# Patient Record
Sex: Female | Born: 1990 | Race: Black or African American | Hispanic: No | Marital: Single | Smoking: Never smoker
Health system: Southern US, Community
[De-identification: ages and names within clinical notes are randomized; demographics above are authoritative.]

## PROBLEM LIST (undated history)

## (undated) ENCOUNTER — Inpatient Hospital Stay (HOSPITAL_COMMUNITY): Payer: Self-pay

## (undated) DIAGNOSIS — N730 Acute parametritis and pelvic cellulitis: Secondary | ICD-10-CM

## (undated) DIAGNOSIS — A549 Gonococcal infection, unspecified: Secondary | ICD-10-CM

## (undated) HISTORY — DX: Acute parametritis and pelvic cellulitis: N73.0

## (undated) HISTORY — DX: Gonococcal infection, unspecified: A54.9

---

## 2012-02-20 DIAGNOSIS — A549 Gonococcal infection, unspecified: Secondary | ICD-10-CM

## 2012-02-20 HISTORY — DX: Gonococcal infection, unspecified: A54.9

## 2012-03-04 ENCOUNTER — Encounter (HOSPITAL_COMMUNITY): Payer: Self-pay | Admitting: *Deleted

## 2012-03-04 DIAGNOSIS — Z79899 Other long term (current) drug therapy: Secondary | ICD-10-CM | POA: Insufficient documentation

## 2012-03-04 DIAGNOSIS — N390 Urinary tract infection, site not specified: Secondary | ICD-10-CM | POA: Insufficient documentation

## 2012-03-04 DIAGNOSIS — R109 Unspecified abdominal pain: Secondary | ICD-10-CM | POA: Insufficient documentation

## 2012-03-04 LAB — CBC WITH DIFFERENTIAL/PLATELET
Basophils Absolute: 0.1 10*3/uL (ref 0.0–0.1)
Basophils Relative: 1 % (ref 0–1)
Eosinophils Absolute: 0 10*3/uL (ref 0.0–0.7)
Eosinophils Relative: 0 % (ref 0–5)
HCT: 39 % (ref 36.0–46.0)
MCHC: 34.1 g/dL (ref 30.0–36.0)
MCV: 86.5 fL (ref 78.0–100.0)
Monocytes Absolute: 0.8 10*3/uL (ref 0.1–1.0)
RDW: 13.1 % (ref 11.5–15.5)

## 2012-03-04 LAB — COMPREHENSIVE METABOLIC PANEL
AST: 15 U/L (ref 0–37)
Albumin: 4 g/dL (ref 3.5–5.2)
Calcium: 9.6 mg/dL (ref 8.4–10.5)
Creatinine, Ser: 0.65 mg/dL (ref 0.50–1.10)

## 2012-03-04 LAB — URINALYSIS, ROUTINE W REFLEX MICROSCOPIC
Bilirubin Urine: NEGATIVE
Hgb urine dipstick: NEGATIVE
Protein, ur: NEGATIVE mg/dL
Urobilinogen, UA: 0.2 mg/dL (ref 0.0–1.0)

## 2012-03-04 LAB — PREGNANCY, URINE: Preg Test, Ur: NEGATIVE

## 2012-03-04 LAB — URINE MICROSCOPIC-ADD ON

## 2012-03-04 NOTE — ED Notes (Signed)
The pt has had lower abd pain for 3 days with urinary frequency and pain ful urination .  lmp now

## 2012-03-05 ENCOUNTER — Emergency Department (HOSPITAL_COMMUNITY)
Admission: EM | Admit: 2012-03-05 | Discharge: 2012-03-05 | Disposition: A | Discharge status: Home / Self Care | Payer: BC Managed Care – PPO | Attending: Emergency Medicine | Admitting: Emergency Medicine

## 2012-03-05 DIAGNOSIS — R109 Unspecified abdominal pain: Secondary | ICD-10-CM

## 2012-03-05 DIAGNOSIS — N390 Urinary tract infection, site not specified: Secondary | ICD-10-CM

## 2012-03-05 MED ORDER — RANITIDINE HCL 150 MG PO CAPS: 150.0000 mg | ORAL_CAPSULE | Freq: Two times a day (BID) | ORAL | Status: DC

## 2012-03-05 MED ORDER — TRAMADOL HCL 50 MG PO TABS
50.0000 mg | ORAL_TABLET | Freq: Once | ORAL | Status: AC
  Administered 2012-03-05: 50 mg via ORAL
  Filled 2012-03-05: qty 1

## 2012-03-05 MED ORDER — GI COCKTAIL ~~LOC~~
30.0000 mL | Freq: Once | ORAL | Status: AC
  Administered 2012-03-05: 30 mL via ORAL
  Filled 2012-03-05: qty 30

## 2012-03-05 MED ORDER — CEPHALEXIN 500 MG PO CAPS: 500.0000 mg | ORAL_CAPSULE | Freq: Four times a day (QID) | ORAL | Status: DC

## 2012-03-05 MED ORDER — TRAMADOL HCL 50 MG PO TABS: 50.0000 mg | ORAL_TABLET | Freq: Four times a day (QID) | ORAL | Status: DC | PRN

## 2012-03-05 NOTE — ED Provider Notes (Signed)
History     CSN: [id number]  Arrival date & time 03/04/12  2154   First MD Initiated Contact with Patient 03/05/12 0149      Chief Complaint  Patient presents with  . Abdominal Pain    (Consider location/radiation/quality/duration/timing/severity/associated sxs/prior treatment) Patient is a 22 y.o. female presenting with abdominal pain. The history is provided by the patient (the pt complains of dysuria and abd pain). No language interpreter was used.  Abdominal Pain Pain location:  Epigastric and suprapubic Pain quality: aching   Pain radiates to:  Does not radiate Pain severity:  Moderate Onset quality:  Gradual Timing:  Constant Progression:  Waxing and waning Chronicity:  New Context: not alcohol use   Associated symptoms: no chest pain, no cough, no diarrhea, no fatigue and no hematuria     History reviewed. No pertinent past medical history.  History reviewed. No pertinent past surgical history.  No family history on file.  History  Substance Use Topics  . Smoking status: Never Smoker   . Smokeless tobacco: Not on file  . Alcohol Use: Yes    OB History   Grav Para Term Preterm Abortions TAB SAB Ect Mult Living                  Review of Systems  Constitutional: Negative for fatigue.  HENT: Negative for congestion, sinus pressure and ear discharge.   Eyes: Negative for discharge.  Respiratory: Negative for cough.   Cardiovascular: Negative for chest pain.  Gastrointestinal: Positive for abdominal pain. Negative for diarrhea.  Genitourinary: Negative for frequency and hematuria.  Musculoskeletal: Negative for back pain.  Skin: Negative for rash.  Neurological: Negative for seizures and headaches.  Psychiatric/Behavioral: Negative for hallucinations.    Allergies  Review of patient's allergies indicates no known allergies.  Home Medications   Current Outpatient Rx  Name  Route  Sig  Dispense  Refill  . calcium carbonate (TUMS - DOSED IN MG  ELEMENTAL CALCIUM) 500 MG chewable tablet   Oral   Chew 1 tablet by mouth daily as needed for heartburn.         Marland Kitchen ibuprofen (ADVIL,MOTRIN) 200 MG tablet   Oral   Take 200 mg by mouth every 6 (six) hours as needed for pain.         . cephALEXin (KEFLEX) 500 MG capsule   Oral   Take 1 capsule (500 mg total) by mouth 4 (four) times daily.   28 capsule   0   . ranitidine (ZANTAC) 150 MG capsule   Oral   Take 1 capsule (150 mg total) by mouth 2 (two) times daily.   30 capsule   0   . traMADol (ULTRAM) 50 MG tablet   Oral   Take 1 tablet (50 mg total) by mouth every 6 (six) hours as needed for pain.   15 tablet   0     BP 117/78  Pulse 124  Temp(Src) 99.4 F (37.4 C) (Oral)  Resp 18  SpO2 98%  LMP 03/04/2012  Physical Exam  Constitutional: She is oriented to person, place, and time. She appears well-developed.  HENT:  Head: Normocephalic and atraumatic.  Eyes: Conjunctivae and EOM are normal. No scleral icterus.  Neck: Neck supple. No thyromegaly present.  Cardiovascular: Normal rate and regular rhythm.  Exam reveals no gallop and no friction rub.   No murmur heard. Pulmonary/Chest: No stridor. She has no wheezes. She has no rales. She exhibits no tenderness.  Abdominal: She  exhibits no distension. There is no tenderness. There is no rebound.  Mild epigastric tenderness  Musculoskeletal: Normal range of motion. She exhibits no edema.  Lymphadenopathy:    She has no cervical adenopathy.  Neurological: She is oriented to person, place, and time. Coordination normal.  Skin: No rash noted. No erythema.  Psychiatric: She has a normal mood and affect. Her behavior is normal.    ED Course  Procedures (including critical care time)  Labs Reviewed  URINALYSIS, ROUTINE W REFLEX MICROSCOPIC - Abnormal; Notable for the following:    Leukocytes, UA MODERATE (*)    All other components within normal limits  CBC WITH DIFFERENTIAL - Abnormal; Notable for the following:     Neutrophils Relative 79 (*)    Neutro Abs 7.8 (*)    All other components within normal limits  URINE MICROSCOPIC-ADD ON - Abnormal; Notable for the following:    Squamous Epithelial / LPF FEW (*)    All other components within normal limits  URINE CULTURE  PREGNANCY, URINE  COMPREHENSIVE METABOLIC PANEL  LIPASE, BLOOD   No results found.   1. UTI (lower urinary tract infection)   2. Abdominal pain     Pt improved with gi cocktail  MDM          Benny Lennert, MD 03/05/12 [phone]

## 2012-03-06 ENCOUNTER — Emergency Department (HOSPITAL_COMMUNITY): Payer: BC Managed Care – PPO

## 2012-03-06 ENCOUNTER — Encounter (HOSPITAL_COMMUNITY): Payer: Self-pay | Admitting: Emergency Medicine

## 2012-03-06 ENCOUNTER — Emergency Department (HOSPITAL_COMMUNITY)
Admission: EM | Admit: 2012-03-06 | Discharge: 2012-03-06 | Disposition: A | Discharge status: Home / Self Care | Payer: BC Managed Care – PPO | Attending: Emergency Medicine | Admitting: Emergency Medicine

## 2012-03-06 DIAGNOSIS — N898 Other specified noninflammatory disorders of vagina: Secondary | ICD-10-CM | POA: Insufficient documentation

## 2012-03-06 DIAGNOSIS — R112 Nausea with vomiting, unspecified: Secondary | ICD-10-CM | POA: Insufficient documentation

## 2012-03-06 DIAGNOSIS — N739 Female pelvic inflammatory disease, unspecified: Secondary | ICD-10-CM

## 2012-03-06 DIAGNOSIS — Z79899 Other long term (current) drug therapy: Secondary | ICD-10-CM | POA: Insufficient documentation

## 2012-03-06 LAB — CBC WITH DIFFERENTIAL/PLATELET
Eosinophils Absolute: 0 10*3/uL (ref 0.0–0.7)
Hemoglobin: 12.9 g/dL (ref 12.0–15.0)
Lymphocytes Relative: 7 % — ABNORMAL LOW (ref 12–46)
Lymphs Abs: 1 10*3/uL (ref 0.7–4.0)
MCH: 29.3 pg (ref 26.0–34.0)
MCV: 86.1 fL (ref 78.0–100.0)
Monocytes Relative: 9 % (ref 3–12)
Neutrophils Relative %: 84 % — ABNORMAL HIGH (ref 43–77)
RBC: 4.4 MIL/uL (ref 3.87–5.11)
WBC: 14.9 10*3/uL — ABNORMAL HIGH (ref 4.0–10.5)

## 2012-03-06 LAB — COMPREHENSIVE METABOLIC PANEL
ALT: 6 U/L (ref 0–35)
Alkaline Phosphatase: 73 U/L (ref 39–117)
BUN: 10 mg/dL (ref 6–23)
CO2: 27 mEq/L (ref 19–32)
GFR calc Af Amer: >90 mL/min (ref >90)
GFR calc non Af Amer: >90 mL/min (ref >90)
Glucose, Bld: 84 mg/dL (ref 70–99)
Potassium: 4 mEq/L (ref 3.5–5.1)
Total Bilirubin: 0.8 mg/dL (ref 0.3–1.2)
Total Protein: 8.1 g/dL (ref 6.0–8.3)

## 2012-03-06 LAB — URINALYSIS, ROUTINE W REFLEX MICROSCOPIC
Ketones, ur: 15 mg/dL — AB
Nitrite: NEGATIVE
Protein, ur: NEGATIVE mg/dL
Urobilinogen, UA: 1 mg/dL (ref 0.0–1.0)

## 2012-03-06 LAB — WET PREP, GENITAL: Clue Cells Wet Prep HPF POC: NONE SEEN

## 2012-03-06 LAB — URINE CULTURE: Culture: NO GROWTH

## 2012-03-06 LAB — URINE MICROSCOPIC-ADD ON

## 2012-03-06 MED ORDER — IOHEXOL 300 MG/ML  SOLN
50.0000 mL | Freq: Once | INTRAMUSCULAR | Status: AC | PRN
  Administered 2012-03-06: 50 mL via ORAL

## 2012-03-06 MED ORDER — CIPROFLOXACIN HCL 500 MG PO TABS: 500.0000 mg | ORAL_TABLET | Freq: Two times a day (BID) | ORAL | Status: DC

## 2012-03-06 MED ORDER — METRONIDAZOLE 500 MG PO TABS: 500.0000 mg | ORAL_TABLET | Freq: Two times a day (BID) | ORAL | Status: DC

## 2012-03-06 MED ORDER — SODIUM CHLORIDE 0.9 % IV SOLN
Freq: Once | INTRAVENOUS | Status: AC
  Administered 2012-03-06: 100 mL/h via INTRAVENOUS

## 2012-03-06 MED ORDER — HYDROMORPHONE HCL PF 1 MG/ML IJ SOLN
1.0000 mg | Freq: Once | INTRAMUSCULAR | Status: AC
  Administered 2012-03-06: 1 mg via INTRAVENOUS
  Filled 2012-03-06: qty 1

## 2012-03-06 MED ORDER — IOHEXOL 300 MG/ML  SOLN
100.0000 mL | Freq: Once | INTRAMUSCULAR | Status: AC | PRN
  Administered 2012-03-06: 100 mL via INTRAVENOUS

## 2012-03-06 MED ORDER — FENTANYL CITRATE 0.05 MG/ML IJ SOLN
50.0000 ug | Freq: Once | INTRAMUSCULAR | Status: AC
  Administered 2012-03-06: 50 ug via INTRAVENOUS
  Filled 2012-03-06: qty 2

## 2012-03-06 MED ORDER — DEXTROSE 5 % IV SOLN
1.0000 g | Freq: Once | INTRAVENOUS | Status: AC
  Administered 2012-03-06: 1 g via INTRAVENOUS
  Filled 2012-03-06: qty 10

## 2012-03-06 MED ORDER — OXYCODONE-ACETAMINOPHEN 5-325 MG PO TABS: 1.0000 | ORAL_TABLET | Freq: Four times a day (QID) | ORAL | Status: DC | PRN

## 2012-03-06 NOTE — ED Notes (Signed)
Pt in ultrasound

## 2012-03-06 NOTE — ED Provider Notes (Signed)
History     CSN: [id number]  Arrival date & time 03/06/12  1223   First MD Initiated Contact with Patient 03/06/12 1505      Chief Complaint  Patient presents with  . Abdominal Pain   HPI Prakriti Detter is a 22 y.o. female who presents to the ED for concern of abdominal pain and vaginal bleeding.  Patient reports that this pain started three days ago.  Was seen here and had labs and urine done and was diagnosed with UTI and sent home on cephalexin.  Since then she started vomiting several times yesterday.  NBNB and started having vaginal bleeding. Reports that this is the time she normally has her period in the month but had some bleeding 2 weeks ago as well so she is confused.  Pain not c/w periods.  Worst in RLQ.  Radiates to back and to LLQ.  Sharp.  8/10 in severity.  Worse with palpation, betterw ith rest.  No other symptoms.  History reviewed. No pertinent past medical history.  History reviewed. No pertinent past surgical history.  Family History: Reviewed.  None pertinent.    History  Substance Use Topics  . Smoking status: Never Smoker   . Smokeless tobacco: Not on file  . Alcohol Use: Yes     Comment: Occasional     Review of Systems  Constitutional: Negative for fever and chills.  HENT: Negative for congestion, rhinorrhea, neck pain and neck stiffness.   Respiratory: Negative for cough and shortness of breath.   Cardiovascular: Negative for chest pain.  Gastrointestinal: Positive for nausea, vomiting and abdominal pain. Negative for diarrhea and abdominal distention.  Endocrine: Negative for polyuria.  Genitourinary: Positive for vaginal bleeding. Negative for dysuria.  Skin: Negative for rash.  Neurological: Negative for headaches.  Psychiatric/Behavioral: Negative.   All other systems reviewed and are negative.    Allergies  Review of patient's allergies indicates no known allergies.  Home Medications   Current Outpatient Rx  Name  Route  Sig   Dispense  Refill  . calcium carbonate (TUMS - DOSED IN MG ELEMENTAL CALCIUM) 500 MG chewable tablet   Oral   Chew 1 tablet by mouth daily as needed for heartburn.         . cephALEXin (KEFLEX) 500 MG capsule   Oral   Take 1 capsule (500 mg total) by mouth 4 (four) times daily.   28 capsule   0   . ibuprofen (ADVIL,MOTRIN) 200 MG tablet   Oral   Take 200 mg by mouth every 6 (six) hours as needed for pain.         . ranitidine (ZANTAC) 150 MG capsule   Oral   Take 1 capsule (150 mg total) by mouth 2 (two) times daily.   30 capsule   0   . traMADol (ULTRAM) 50 MG tablet   Oral   Take 1 tablet (50 mg total) by mouth every 6 (six) hours as needed for pain.   15 tablet   0     BP 122/75  Pulse 82  Temp(Src) 98.1 F (36.7 C) (Oral)  Resp 18  SpO2 100%  LMP 02/21/2012  Physical Exam  Nursing note and vitals reviewed. Constitutional: She is oriented to person, place, and time. She appears well-developed and well-nourished. No distress.  HENT:  Head: Normocephalic and atraumatic.  Right Ear: External ear normal.  Left Ear: External ear normal.  Nose: Nose normal.  Mouth/Throat: Oropharynx is clear and moist. No oropharyngeal  exudate.  Eyes: EOM are normal. Pupils are equal, round, and reactive to light.  Neck: Normal range of motion. Neck supple. No tracheal deviation present.  Cardiovascular: Normal rate.   Pulmonary/Chest: Effort normal and breath sounds normal. No stridor. No respiratory distress. She has no wheezes. She has no rales.  Abdominal: Soft. She exhibits no distension. There is tenderness in the right lower quadrant. There is tenderness at McBurney's point. There is no rigidity, no rebound and no guarding.  Genitourinary: Vagina normal. Cervix exhibits no motion tenderness and no discharge. Right adnexum displays tenderness. Left adnexum displays no tenderness.  Musculoskeletal: Normal range of motion.  Neurological: She is alert and oriented to person,  place, and time.  Skin: Skin is warm and dry. She is not diaphoretic.    ED Course  Procedures (including critical care time)  Labs Reviewed  CBC WITH DIFFERENTIAL  COMPREHENSIVE METABOLIC PANEL  URINALYSIS, ROUTINE W REFLEX MICROSCOPIC  LACTIC ACID, PLASMA   No results found.   No diagnosis found.    MDM   Mekisha Bittel is a 22 y.o. female who presents to the ED with 3 days of RLQ pain, vomiting, and vaginal bleeding.  Exam concerning for appendicitis.  Labs checked.  Pelvic exam with blood in vault but no active bleeding.  CT scan showing possible PID.  [place] ordered showing no abscess or torsion.  Spoke briefly with surgery and they are recommending GYN involvement.  GYN wantign to start on cipro/flagyl and give Rocephin here.  Orders placed.  Patient to f/u with GYN in 10-14 days.  Safe for discharge.  Tolerating PO.  Return precautions given.  Patient discharged.Arloa Koh, MD 03/07/12 [phone]

## 2012-03-06 NOTE — ED Notes (Signed)
Pt c/o abdominal pain onset Friday. Pt seen here Friday for same, today with increase in abdominal pain and now having abnormal vaginal bleeding with low back pain. Pt using regular pads and use about 2 within the hour. Pt had n/v/d yesterday but not today.

## 2012-03-06 NOTE — ED Provider Notes (Addendum)
I saw and evaluated the patient, reviewed the resident's note and I agree with the findings and plan.  Patient was approximately 72 hours of gradually worsening abdominal pain that began in the midepigastric area has now gone to the right lower quadrant. Was seen one and a half days ago and was being treated for a urinary tract infection. She was told to return for worsening symptoms. Coincidentally she has had some menstrual type symptoms,. Plan is to perform pelvic examination but will proceed with a abdominal CT scan to rule out appendicitis.  No febrile or toxic appearing.  Abd is soft, but mild guarding and rebound in RLQ.  I suspect acute appendicitis clinically.  If confirmed, will need admission and consult with general surgery.      Judy Boyle. Mont Jagoda, MD 03/06/12 1605    Results for orders placed during the hospital encounter of 03/06/12 (from the past 24 hour(s))  CBC WITH DIFFERENTIAL     Status: Abnormal   Collection Time    03/06/12  2:37 PM      Result Value Range   WBC 14.9 (*) 4.0 - 10.5 K/uL   RBC 4.40  3.87 - 5.11 MIL/uL   Hemoglobin 12.9  12.0 - 15.0 g/dL   HCT 96.0  45.4 - 09.8 %   MCV 86.1  78.0 - 100.0 fL   MCH 29.3  26.0 - 34.0 pg   MCHC 34.0  30.0 - 36.0 g/dL   RDW 11.9  14.7 - 82.9 %   Platelets 304  150 - 400 K/uL   Neutrophils Relative 84 (*) 43 - 77 %   Neutro Abs 12.4 (*) 1.7 - 7.7 K/uL   Lymphocytes Relative 7 (*) 12 - 46 %   Lymphs Abs 1.0  0.7 - 4.0 K/uL   Monocytes Relative 9  3 - 12 %   Monocytes Absolute 1.3 (*) 0.1 - 1.0 K/uL   Eosinophils Relative 0  0 - 5 %   Eosinophils Absolute 0.0  0.0 - 0.7 K/uL   Basophils Relative 0  0 - 1 %   Basophils Absolute 0.0  0.0 - 0.1 K/uL  COMPREHENSIVE METABOLIC PANEL     Status: Abnormal   Collection Time    03/06/12  2:37 PM      Result Value Range   Sodium 134 (*) 135 - 145 mEq/L   Potassium 4.0  3.5 - 5.1 mEq/L   Chloride 96  96 - 112 mEq/L   CO2 27  19 - 32 mEq/L   Glucose, Bld 84  70 - 99 mg/dL   BUN 10  6 - 23 mg/dL   Creatinine, Ser 5.62  0.50 - 1.10 mg/dL   Calcium 9.6  8.4 - 13.0 mg/dL   Total Protein 8.1  6.0 - 8.3 g/dL   Albumin 3.4 (*) 3.5 - 5.2 g/dL   AST 15  0 - 37 U/L   ALT 6  0 - 35 U/L   Alkaline Phosphatase 73  39 - 117 U/L   Total Bilirubin 0.8  0.3 - 1.2 mg/dL   GFR calc non Af Amer >90  >90 mL/min   GFR calc Af Amer >90  >90 mL/min  LACTIC ACID, PLASMA     Status: None   Collection Time    03/06/12  2:37 PM      Result Value Range   Lactic Acid, Venous 1.2  0.5 - 2.2 mmol/L  URINALYSIS, ROUTINE W REFLEX MICROSCOPIC     Status: Abnormal  Collection Time    03/06/12  4:06 PM      Result Value Range   Color, Urine YELLOW  YELLOW   APPearance CLEAR  CLEAR   Specific Gravity, Urine 1.021  1.005 - 1.030   pH 6.5  5.0 - 8.0   Glucose, UA NEGATIVE  NEGATIVE mg/dL   Hgb urine dipstick LARGE (*) NEGATIVE   Bilirubin Urine SMALL (*) NEGATIVE   Ketones, ur 15 (*) NEGATIVE mg/dL   Protein, ur NEGATIVE  NEGATIVE mg/dL   Urobilinogen, UA 1.0  0.0 - 1.0 mg/dL   Nitrite NEGATIVE  NEGATIVE   Leukocytes, UA TRACE (*) NEGATIVE  WET PREP, GENITAL     Status: Abnormal   Collection Time    03/06/12  4:06 PM      Result Value Range   Yeast Wet Prep HPF POC NONE SEEN  NONE SEEN   Trich, Wet Prep NONE SEEN  NONE SEEN   Clue Cells Wet Prep HPF POC NONE SEEN  NONE SEEN   WBC, Wet Prep HPF POC FEW (*) NONE SEEN  URINE MICROSCOPIC-ADD ON     Status: None   Collection Time    03/06/12  4:06 PM      Result Value Range   Squamous Epithelial / LPF RARE  RARE   WBC, UA 0-2  <3 WBC/hpf   RBC / HPF 0-2  <3 RBC/hpf       Judy Boyle. Oletta Lamas, MD 03/07/12 [phone]

## 2012-03-06 NOTE — ED Notes (Signed)
States this started on wednesday with feeling of constipation, took some milk of magnesia, had BM after that , started having cramping on Friday, doubling over, seen here on Friday night-- bleeding started last night- wearing pads, has changed pad 3-4 times thus far today. Not passing clots, now heavy bleeding- periods are always heavy bleeding.

## 2012-03-06 NOTE — ED Notes (Signed)
Pt discharged.Vital signs stable and GCS 15 

## 2012-03-06 NOTE — ED Notes (Signed)
Janace Litten (Mother): [phone]

## 2012-03-07 ENCOUNTER — Telehealth (HOSPITAL_COMMUNITY): Payer: Self-pay | Admitting: Emergency Medicine

## 2012-03-10 NOTE — ED Notes (Signed)
+   Gonorrhea Treated with Rocephin and Cipro-[facility] sent

## 2012-03-11 NOTE — ED Notes (Signed)
Patient informed of positive results after id'd x 2 and informed of need to notify partner to be treated. 

## 2012-03-12 ENCOUNTER — Telehealth (HOSPITAL_COMMUNITY): Payer: Self-pay | Admitting: Emergency Medicine

## 2012-04-01 ENCOUNTER — Encounter: Payer: Self-pay | Admitting: Medical

## 2012-04-01 ENCOUNTER — Ambulatory Visit (INDEPENDENT_AMBULATORY_CARE_PROVIDER_SITE_OTHER): Payer: BC Managed Care – PPO | Admitting: Medical

## 2012-04-01 VITALS — BP 114/76 | HR 94 | Ht 66.0 in | Wt 111.6 lb

## 2012-04-01 DIAGNOSIS — N739 Female pelvic inflammatory disease, unspecified: Secondary | ICD-10-CM

## 2012-04-01 LAB — HIV ANTIBODY (ROUTINE TESTING W REFLEX): HIV: NONREACTIVE

## 2012-04-01 NOTE — Patient Instructions (Addendum)
Sexually Transmitted Disease A sexually transmitted disease (STD) is an infection that is passed from person to person during sexual activity. STDs can be spread by different types of germs (bacteria, viruses, parasites). An STD can be passed through:  Spit (saliva).  Semen.  Blood.  Mucus from the vagina.  Pee (urine). HOME CARE   Tell your sex partner(s) that you have an STD. They should be tested and treated.  Take your medicine (antibiotics) as told. Finish them even if you start to feel better.  Only take medicines as told by your doctor.  Rest.  Eat a healthy diet. Drink enough fluids to keep your pee clear or pale yellow.  Do not have sex until treatment is finished. You must follow up with your doctor.  Keep all doctor visits, Pap tests, and blood tests as told by your doctor.  Only use condoms labeled "latex" and lubricants that wash away with water (water-soluble). Do not use petroleum jelly or oils.  Avoid alcohol and illegal drugs.  Get shots (vaccines) for HPV and hepatitis.  Avoid risky sex behavior that can break the skin. GET HELP RIGHT AWAY IF:  You have a fever.  You have new problems, or your problems get worse. MAKE SURE YOU:  Understand these instructions.  Will watch your condition.  Will get help right away if you are not doing well or get worse. Document Released: 02/13/2004 Document Revised: 03/30/2011 Document Reviewed: 05/05/2010 [facility] Patient Information 2013 [place], [place]. Contraception Choices Birth control (contraception) can stop pregnancy from happening. Different types of birth control work in different ways. Some can:  Make the mucus in the cervix thick. This makes it hard for sperm to get into the uterus.  Thin the lining of the uterus. This makes it hard for an egg to attach to the wall of the uterus.  Stop the ovaries from releasing an egg.  Block the sperm from reaching the egg. Certain types of surgery can stop  pregnancy from happening. For women, the sugery closes the fallopian tubes (tubal ligation). For men, the surgery stops sperm from releasing during sex (vasectomy). HORMONAL BIRTH CONTROL Hormonal birth control stops pregnancy by putting hormones into your body. Types of birth control include:  A small tube put under the skin of the upper arm (implant). The tube can stay in place for 3 years.  Shots given every 3 months.  Pills taken every day or once after sex (intercourse).  Patches that are changed once a week.  A ring put into the vagina (vaginal ring). The ring is left in place for 3 weeks and removed for 1 week. Then, a new ring is put in the vagina. BARRIER BIRTH CONTROL  Barrier birth control blocks sperm from reaching the egg. Types of birth control include:   A thin covering worn on the penis (female condom) during sex.  A soft, loose covering put into the vagina (female condom) before sex.  A rubber bowl that sits over the cervix (diaphragm). The bowl must be made for you. The bowl is put into the vagina before sex. The bowl is left in place for 6 to 8 hours after sex.  A small, soft cup that fits over the cervix (cervical cap). The cup must be made for you. The cup can be left in place for 48 hours after sex.  A sponge that is put into the vagina before sex.  A chemical that kills or blocks sperm from getting into the cervix and uterus (  spermicide). The chemical may be a cream, jelly, foam, or pill. INTRAUTERINE (IUD) BIRTH CONTROL  IUD birth control is a small, T-shaped piece of plastic. The plastic is put inside the uterus. There are 2 types of IUD:  Copper IUD. The IUD is covered in copper wire. The copper makes a fluid that kills sperm. It can stay in place for 10 years.  Hormone IUD. The hormone stops pregnancy from happening. It can stay in place for 5 years. NATURAL FAMILY PLANNING BIRTH CONTROL  Natural family planning means not having sex or using barrier birth  control when the woman is fertile. A woman can:  Use a calendar to keep track of when she is fertile.  Use a thermometer to measure her body temperature. Protect yourself against sexual diseases no matter what type of birth control you use. Talk to your doctor about which type of birth control is best for you. Document Released: 11/02/2008 Document Revised: 03/30/2011 Document Reviewed: 05/14/2010 [facility] Patient Information 2013 [place], [place].

## 2012-04-01 NOTE — Progress Notes (Signed)
Patient ID: Judy Boyle, female   DOB: Aug 06, 1990, 22 y.o.   MRN: [id number]  History:  [name]  is a 22 y.o. No obstetric history on file. who presents to clinic today for ED follow-up after PID. The patient was seen at [facility] on 03/04/12 and 03/06/12 for abdominal pain. She had cultures + gonorrhea and received Rocephin IM. She was also treated with Cipro and Flagyl. The patient states that she completed the full course of all treatments. The patient states that she is having occasional abdominal discomfort but minimal in the lower abdomen. The patient states that she has some "pressure" with BM. She also has dysmenorrhea with regular periods. Patient desires STD testing today. The patient states that she is sexually active and uses condoms sometimes. She is still with the same partner as when she was diagnosed with gonorrhea. She states that he was treated as well. She is not on any birth control. She does not desire pregnancy.   The following portions of the patient's history were reviewed and updated as appropriate: allergies, current medications, past family history, past medical history, past social history, past surgical history and problem list.  Review of Systems:  Pertinent items are noted in HPI.  Objective:  Physical Exam BP 114/76  Pulse 94  Ht 5\' 6"  (1.676 m)  Wt 111 lb 9.6 oz (50.621 kg)  BMI 18.02 kg/m2  LMP 03/13/2012 GENERAL: Well-developed, well-nourished female in no acute distress.  HEENT: Normocephalic, atraumatic.  LUNGS: Normal effort, clear to auscultation HEART: Regular rate and rhythm.  ABDOMEN: Soft, mild tenderness to the upper abdomen, nondistended. No organomegaly. Normal bowel sounds appreciated in all quadrants.  PELVIC: Normal external female genitalia. Vagina is pink and rugated.  Normal discharge. Normal cervix contour. GC/Chlamydia obtained. Uterus is normal in size. No adnexal mass or tenderness.  EXTREMITIES: No cyanosis, clubbing, or  edema.    Labs and Imaging US Pelvis Complete  03/06/2012  *RADIOLOGY REPORT*  Clinical Data:  Right-sided abdominal and pelvic pain.  Ovarian torsion.  TRANSABDOMINAL AND TRANSVAGINAL ULTRASOUND OF PELVIS DOPPLER ULTRASOUND OF OVARIES  Technique:  Both transabdominal and transvaginal ultrasound examinations of the pelvis were performed. Transabdominal technique was performed for global imaging of the pelvis including uterus, ovaries, adnexal regions, and pelvic cul-de-sac.  It was necessary to proceed with endovaginal exam following the transabdominal exam to visualize the uterus and adnexa.  Color and duplex Doppler ultrasound was utilized to evaluate blood flow to the ovaries.  Comparison:  CT 03/07/2011.  Findings:  Uterus:  71 mm x 35 mm x 40 mm.  Normal myometrial echotexture.  Endometrium:  8 mm, normal.  Right ovary: Physiologic appearance of the right ovary.  41 mm x 33 mm x 37 mm.  No hydrosalpinx or evidence of tubal ovarian abscess.  Left ovary:   Physiologic appearance of the left ovary measuring 32 mm x 19 mm x 28 mm.  No hydrosalpinx or evidence of tubal ovarian abscess.  Pulsed Doppler evaluation demonstrates normal low-resistance arterial and venous waveforms in both ovaries.  Moderate amount of free fluid is present in the anatomic pelvis. Multiple fluid filled loops of small bowel are identified in the anatomic pelvis.  Echogenic fluid extends around the right adnexa and uterus.  IMPRESSION: Physiologic appearance of the uterus and ovaries bilaterally.  The inflammatory process of the right anatomic pelvis does not appear to involve the uterus, fallopian tubes or ovaries.  Echogenic fluid in the anatomic pelvis potentially represents blood or exudate/pus.  No sonographic evidence for ovarian torsion.   Original Report Authenticated By: Andreas Newport, M.D.    Ct Abdomen Pelvis W Contrast  03/06/2012  *RADIOLOGY REPORT*  Clinical Data: Abdominal pain since Friday, low back pain and vaginal  bleeding  CT ABDOMEN AND PELVIS WITH CONTRAST  Technique:  Multidetector CT imaging of the abdomen and pelvis was performed following the standard protocol during bolus administration of intravenous contrast.  Contrast: OMNIPAQUE IOHEXOL 300 MG/ML  SOLN  Comparison: None.  Findings:  Lower Chest:  The lung bases are clear.  Visualized cardiac structures within normal limits for size.  No pericardial effusion. Distal thoracic esophagus is unremarkable.  Abdomen/Pelvis: The stomach is distended with ingested oral contrast material.  No focal lesion, or wall thickening identified. Unremarkable CT appearance of the duodenum, spleen, adrenal glands, kidneys, liver and pancreas. Gallbladder is unremarkable. No intra or extrahepatic biliary ductal dilatation.  Per the  There is diffuse distension of numerous loops of small bowel containing ingested oral contrast material throughout the abdomen. In the low anatomic pelvis there is focal dilution of the ingested oral contrast material as well as some mild thickening of the ileal wall.  The normal appendix can be identified interposed between the medial aspect of the cecum and so as muscle.  A focal rim enhancing fluid collection in the right aspect of the cul-de-sac of Douglas measures 3.2 x 1.8 cm in diameter.  This is concerning for infected peritoneal fluid.  The terminal ileum is completely decompressed. There is hyperenhancement of bowel wall mucosa in the right lower quadrant adjacent to the adnexa.  Secondary to inflammatory change and the minimal intra-abdominal fat in this very difficult to separate the right adnexa from the abnormal loop of distal ileum. The bladder is moderately distended with urine.  Bones: No acute fracture or aggressive appearing lytic or blastic osseous lesion.  Vascular: No focal vascular abnormality.  IMPRESSION:  1.  Reactive small bowel ileus secondary to an inflammatory process centered in the right aspect of the anatomic pelvis.   There is a small ring enhancing fluid collection in the right aspect of the recess of Douglas.  Overall, the CT findings are most concerning for pelvic inflammatory disease versus a distal ileitis with a small abscess collection in the cul-de-sac of Douglas.  2.  The normal appendix is identified between the cecum and right psoas muscle.   Original Report Authenticated By: Malachy Moan, M.D.    [place] Art/ven Flow Abd Pelv Doppler  03/06/2012  *RADIOLOGY REPORT*  Clinical Data:  Right-sided abdominal and pelvic pain.  Ovarian torsion.  TRANSABDOMINAL AND TRANSVAGINAL ULTRASOUND OF PELVIS DOPPLER ULTRASOUND OF OVARIES  Technique:  Both transabdominal and transvaginal ultrasound examinations of the pelvis were performed. Transabdominal technique was performed for global imaging of the pelvis including uterus, ovaries, adnexal regions, and pelvic cul-de-sac.  It was necessary to proceed with endovaginal exam following the transabdominal exam to visualize the uterus and adnexa.  Color and duplex Doppler ultrasound was utilized to evaluate blood flow to the ovaries.  Comparison:  CT 03/07/2011.  Findings:  Uterus:  71 mm x 35 mm x 40 mm.  Normal myometrial echotexture.  Endometrium:  8 mm, normal.  Right ovary: Physiologic appearance of the right ovary.  41 mm x 33 mm x 37 mm.  No hydrosalpinx or evidence of tubal ovarian abscess.  Left ovary:   Physiologic appearance of the left ovary measuring 32 mm x 19 mm x 28 mm.  No hydrosalpinx  or evidence of tubal ovarian abscess.  Pulsed Doppler evaluation demonstrates normal low-resistance arterial and venous waveforms in both ovaries.  Moderate amount of free fluid is present in the anatomic pelvis. Multiple fluid filled loops of small bowel are identified in the anatomic pelvis.  Echogenic fluid extends around the right adnexa and uterus.  IMPRESSION: Physiologic appearance of the uterus and ovaries bilaterally.  The inflammatory process of the right anatomic pelvis does  not appear to involve the uterus, fallopian tubes or ovaries.  Echogenic fluid in the anatomic pelvis potentially represents blood or exudate/pus.  No sonographic evidence for ovarian torsion.   Original Report Authenticated By: Andreas Newport, M.D.     Assessment & Plan:  Assessment: PID, resolved Gonorrhea, resolved STD testing  Plans: Discussed options for birth control. Patient is unsure of which she would prefer at this time. She has been given information about options and will call to schedule if she would like to discuss further. Encouraged regular condom use until started on birth control.  TOC for GC/Chlamdyia today.  HIV, RPR, Hep B and Hep C drawn today.  Patient will be contacted with any abnormal results Patient may return to clinic PRN

## 2012-06-19 HISTORY — PX: WISDOM TOOTH EXTRACTION: SHX21

## 2012-07-29 ENCOUNTER — Ambulatory Visit (INDEPENDENT_AMBULATORY_CARE_PROVIDER_SITE_OTHER): Payer: BC Managed Care – PPO | Admitting: Gynecology

## 2012-07-29 ENCOUNTER — Encounter: Payer: Self-pay | Admitting: Gynecology

## 2012-07-29 VITALS — BP 100/70 | HR 76 | Resp 18 | Ht 66.0 in | Wt 113.0 lb

## 2012-07-29 DIAGNOSIS — Z0000 Encounter for general adult medical examination without abnormal findings: Secondary | ICD-10-CM

## 2012-07-29 DIAGNOSIS — R300 Dysuria: Secondary | ICD-10-CM

## 2012-07-29 DIAGNOSIS — Z8742 Personal history of other diseases of the female genital tract: Secondary | ICD-10-CM

## 2012-07-29 DIAGNOSIS — Z309 Encounter for contraceptive management, unspecified: Secondary | ICD-10-CM

## 2012-07-29 DIAGNOSIS — B009 Herpesviral infection, unspecified: Secondary | ICD-10-CM

## 2012-07-29 LAB — POCT URINALYSIS DIPSTICK

## 2012-07-29 LAB — HEMOGLOBIN, FINGERSTICK: Hemoglobin, fingerstick: 12.8 g/dL (ref 12.0–16.0)

## 2012-07-29 MED ORDER — VALACYCLOVIR HCL 500 MG PO TABS: 500.0000 mg | ORAL_TABLET | Freq: Every day | ORAL | Status: DC

## 2012-07-29 MED ORDER — VALACYCLOVIR HCL 1 G PO TABS: 1000.0000 mg | ORAL_TABLET | Freq: Two times a day (BID) | ORAL | Status: DC

## 2012-07-29 MED ORDER — CIPROFLOXACIN HCL 500 MG PO TABS: 500.0000 mg | ORAL_TABLET | Freq: Two times a day (BID) | ORAL | Status: DC

## 2012-07-29 MED ORDER — LIDOCAINE HCL 2 % EX GEL: CUTANEOUS | Status: DC | PRN

## 2012-07-29 MED ORDER — ETONOGESTREL-ETHINYL ESTRADIOL 0.12-0.015 MG/24HR VA RING: VAGINAL_RING | VAGINAL | Status: DC

## 2012-07-29 MED ORDER — ETONOGESTREL-ETHINYL ESTRADIOL 0.12-0.015 MG/24HR VA RING: 1.0000 | VAGINAL_RING | VAGINAL | Status: DC

## 2012-07-29 NOTE — Patient Instructions (Signed)
Take valtrex as directed for initial outbreak, then 5oomg a day thereafter.  If another outbreak, increase 500mg  twice a day for 3d, then return to  Daily. Remove nuvaring on 12th of month and replace new one on 15th Condoms Sin up for MyChart  Genital Herpes Genital herpes is a sexually transmitted disease. This means that it is a disease passed by having sex with an infected person. There is no cure for genital herpes. The time between attacks can be months to years. The virus may live in a person but produce no problems (symptoms). This infection can be passed to a baby as it travels down the birth canal (vagina). In a newborn, this can cause central nervous system damage, eye damage, or even death. The virus that causes genital herpes is usually HSV-2 virus. The virus that causes oral herpes is usually HSV-1. The diagnosis (learning what is wrong) is made through culture results. SYMPTOMS  Usually symptoms of pain and itching begin a few days to a week after contact. It first appears as small blisters that progress to small painful ulcers which then scab over and heal after several days. It affects the outer genitalia, birth canal, cervix, penis, anal area, buttocks, and thighs. HOME CARE INSTRUCTIONS   Keep ulcerated areas dry and clean.  Take medications as directed. Antiviral medications can speed up healing. They will not prevent recurrences or cure this infection. These medications can also be taken for suppression if there are frequent recurrences.  While the infection is active, it is contagious. Avoid all sexual contact during active infections.  Condoms may help prevent spread of the herpes virus.  Practice safe sex.  Wash your hands thoroughly after touching the genital area.  Avoid touching your eyes after touching your genital area.  Inform your caregiver if you have had genital herpes and become pregnant. It is your responsibility to insure a safe outcome for your baby in  this pregnancy.  Only take over-the-counter or prescription medicines for pain, discomfort, or fever as directed by your caregiver. SEEK MEDICAL CARE IF:   You have a recurrence of this infection.  You do not respond to medications and are not improving.  You have new sources of pain or discharge which have changed from the original infection.  You have an oral temperature above 102 F (38.9 C).  You develop abdominal pain.  You develop eye pain or signs of eye infection. Document Released: 01/03/2000 Document Revised: 03/30/2011 Document Reviewed: 01/23/2009 [facility] Patient Information 2014 [place], [place].

## 2012-07-29 NOTE — Progress Notes (Signed)
22 y.o. single black female G0  here for problem visit. Pt is  currently sexually active.  She reports not using condoms on a regular basis.  First sexual activity at 22 years old, 5 number of lifetime partners.  Pt was recently seen in ER 02/2012 for pelvic pain and diagnosed with PID +GC, she had a negative test of cure 03/2012 at the clinic, she and her partner were both treated.  Pt is not on contraception currently and uses condoms sometimes. Pt not interested in pregnancy at this time but doesn't want ocp, used for 2-23m, and 1y ago stopped due to noncompliance.  Pt reports dysuria, urgency no fever or chills, had some keflex at home, took 4x/d had from prior ER visit, medication change.  Patient's last menstrual period was 07/28/2012.          Sexually active: yes  The current method of family planning is none.    Exercising: yes   Last pap: 12/13 Alcohol: no Tobacco: no Drugs: no Gardisil: yes, completed: unsure   Health Maintenance  Topic Date Due  . Pap Smear  04/10/2007  . Tetanus/tdap  04/09/2008  . Influenza Vaccine  09/19/2012    Family History  Problem Relation Age of Onset  . Diabetes Maternal Uncle   . Breast cancer Maternal Grandmother     Patient Active Problem List   Diagnosis Date Noted  . PID (pelvic inflammatory disease) 04/01/2012    Past Medical History  Diagnosis Date  . PID (acute pelvic inflammatory disease)   . Gonorrhea 2/14    Past Surgical History  Procedure Laterality Date  . Wisdom tooth extraction  6/14    Allergies: Review of patient's allergies indicates no known allergies.  No current outpatient prescriptions on file.   No current facility-administered medications for this visit.    ROS: Pertinent items are noted in HPI.  Exam:    BP 100/70  Pulse 76  Resp 18  Ht 5\' 6"  (1.676 m)  Wt 113 lb (51.256 kg)  BMI 18.25 kg/m2  LMP 07/28/2012 Weight change: @WEIGHTCHANGE @ Last 3 height recordings:  Ht Readings from Last 3  Encounters:  07/29/12 5\' 6"  (1.676 m)  04/01/12 5\' 6"  (1.676 m)   General appearance: alert, cooperative and appears stated age Head: Normocephalic, without obvious abnormality, atraumatic Neck: no adenopathy, no carotid bruit, no JVD, supple, symmetrical, trachea midline and thyroid not enlarged, symmetric, no tenderness/mass/nodules Lungs: clear to auscultation bilaterally Breasts: normal appearance, no masses or tenderness Heart: regular rate and rhythm, S1, S2 normal, no murmur, click, rub or gallop Abdomen: soft, non-tender; bowel sounds normal; no masses,  no organomegaly Extremities: extremities normal, atraumatic, no cyanosis or edema Skin: Skin color, texture, turgor normal. No rashes or lesions Lymph nodes: Cervical, supraclavicular, and axillary nodes normal. no inguinal nodes palpated Neurologic: Grossly normal   Pelvic: External genitalia:  Herpetic like lesions on labia minora and posterior forchette, in clusters, tender              Urethra: normal appearing urethra with no masses, tenderness or lesions              Bartholins and Skenes: normal                 Vagina: menstrum              Cervix: normal appearance              Pap taken: no  Bimanual Exam:  Uterus:  uterus is normal size, shape, consistency and nontender                                      Adnexa:    normal adnexa in size, nontender and no masses                                      Rectovaginal: Confirms                                      Anus:  normal sphincter tone, no lesions  A: well woman Dysuria with recent PID, +GC infection Vulvar ulceration no contraindication to begin use of oral contraceptives Contraceptive management     P: pap smear deferred will get records from [place] Will obtain cervical cultures as pt has not responded to antibiotics, similarities between UTI and cervical infections reviewed, 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  CONDOMS stressed.  Discussed impact of HSV infection, information provided counseled on STD prevention, use and side effects of OCP's, family planning choices return 57m for annual or prn Discussed STD prevention, regular condom use. Discussed HPV vaccine risks and benefits, pt unsure will get records    An After Visit Summary was printed and given to the patient.

## 2012-07-29 NOTE — Progress Notes (Deleted)
22 y.o. Single African American female   No obstetric history on file. here for annual exam. Pt is currently sexually active.  She reports not using condoms on a regular basis.  First sexual activity at 22 years old, 4 number of lifetime partners.     Patient's last menstrual period was 07/28/2012.          Sexually active: yes  The current method of family planning is none.    Exercising: no  not on a regular basis Last pap: 12/13- Normal Alcohol: 2-3 glasses of wine/wk Tobacco: no Drugs: no Gardisil: yes, completed: not sure.  Hgb: 12.8    Health Maintenance  Topic Date Due  . Pap Smear  04/10/2007  . Tetanus/tdap  04/09/2008  . Influenza Vaccine  09/19/2012    Family History  Problem Relation Age of Onset  . Diabetes Maternal Uncle   . Breast cancer Maternal Grandmother     Patient Active Problem List   Diagnosis Date Noted  . PID (pelvic inflammatory disease) 04/01/2012    Past Medical History  Diagnosis Date  . PID (acute pelvic inflammatory disease)   . Gonorrhea 2/14    Past Surgical History  Procedure Laterality Date  . Wisdom tooth extraction  6/14    Allergies: Review of patient's allergies indicates no known allergies.  No current outpatient prescriptions on file.   No current facility-administered medications for this visit.    ROS: {Ros - complete:30496}  Exam:    BP 100/70  Pulse 76  Resp 18  Ht 5\' 6"  (1.676 m)  Wt 113 lb (51.256 kg)  BMI 18.25 kg/m2  LMP 07/28/2012 Weight change: @WEIGHTCHANGE @ Last 3 height recordings:  Ht Readings from Last 3 Encounters:  07/29/12 5\' 6"  (1.676 m)  04/01/12 5\' 6"  (1.676 m)   General appearance: alert, cooperative and appears stated age Head: Normocephalic, without obvious abnormality, atraumatic Neck: no adenopathy, no carotid bruit, no JVD, supple, symmetrical, trachea midline and thyroid not enlarged, symmetric, no tenderness/mass/nodules Lungs: clear to auscultation bilaterally Breasts: {breast  exam:13139::"normal appearance, no masses or tenderness"} Heart: regular rate and rhythm, S1, S2 normal, no murmur, click, rub or gallop Abdomen: soft, non-tender; bowel sounds normal; no masses,  no organomegaly Extremities: extremities normal, atraumatic, no cyanosis or edema Skin: Skin color, texture, turgor normal. No rashes or lesions Lymph nodes: Cervical, supraclavicular, and axillary nodes normal. no inguinal nodes palpated Neurologic: Grossly normal   Pelvic: External genitalia:  {Exam; genitalia female:32129}              Urethra: {urethra:311719::"not indicated"}              Bartholins and Skenes: {EXAM; GYN ZOXWR:60454}                 Vagina: {vagina:315903::"normal appearing vagina with normal color and discharge, no lesions"}              Cervix: {exam; gyn cervix:30847}              Pap taken: {yes no:314532}        Bimanual Exam:  Uterus:  {uterus:315905::"uterus is normal size, shape, consistency and nontender"}                                      Adnexa:    {adnexa:311645::"not indicated"}  Rectovaginal: {Rectovaginal:16320}                                      Anus:  {Exam; anus:16940}  A: {Gyn assessment:5268::"well woman"} Contraceptive management     P: {plan; gyn:5269::"mammogram","pap smear","return annually or prn"} Discussed STD prevention, regular condom use. Discussed HPV vaccine risks and benefits, pt  {DOES_DOES FAO:13086} give consent    An After Visit Summary was printed and given to the patient.

## 2012-07-30 LAB — URINE CULTURE: Organism ID, Bacteria: NO GROWTH

## 2012-08-01 ENCOUNTER — Telehealth: Payer: Self-pay | Admitting: *Deleted

## 2012-08-01 NOTE — Progress Notes (Signed)
He can get tested by blood, if negative then yes, condoms

## 2012-08-01 NOTE — Telephone Encounter (Signed)
Left Message To Call Back Re: Results 

## 2012-08-02 LAB — IPS N GONORRHOEA AND CHLAMYDIA BY PCR

## 2012-08-02 NOTE — Telephone Encounter (Signed)
Patient notified see labs 

## 2012-10-26 ENCOUNTER — Encounter: Payer: Self-pay | Admitting: Gynecology

## 2013-02-02 ENCOUNTER — Ambulatory Visit: Payer: BC Managed Care – PPO | Admitting: Gynecology

## 2013-02-03 ENCOUNTER — Ambulatory Visit: Payer: BC Managed Care – PPO | Admitting: Gynecology

## 2013-02-14 ENCOUNTER — Ambulatory Visit (INDEPENDENT_AMBULATORY_CARE_PROVIDER_SITE_OTHER): Payer: BC Managed Care – PPO | Admitting: Gynecology

## 2013-02-14 VITALS — BP 106/74 | HR 70 | Resp 12 | Ht 66.0 in | Wt 113.0 lb

## 2013-02-14 DIAGNOSIS — Z124 Encounter for screening for malignant neoplasm of cervix: Secondary | ICD-10-CM

## 2013-02-14 DIAGNOSIS — R1900 Intra-abdominal and pelvic swelling, mass and lump, unspecified site: Secondary | ICD-10-CM

## 2013-02-14 LAB — BASIC METABOLIC PANEL
BUN: 10 mg/dL (ref 6–23)
CHLORIDE: 102 meq/L (ref 96–112)
CO2: 25 mEq/L (ref 19–32)
Calcium: 9.5 mg/dL (ref 8.4–10.5)
Creat: 0.66 mg/dL (ref 0.50–1.10)
GLUCOSE: 73 mg/dL (ref 70–99)
POTASSIUM: 4.2 meq/L (ref 3.5–5.3)
SODIUM: 138 meq/L (ref 135–145)

## 2013-02-14 LAB — HCG, SERUM, QUALITATIVE: PREG SERUM: NEGATIVE

## 2013-02-14 NOTE — Addendum Note (Signed)
Addended by: Lorraine LaxSHAW, Emmanuelle Hibbitts J on: 02/14/2013 01:30 PM   Modules accepted: Orders

## 2013-02-14 NOTE — Progress Notes (Signed)
Subjective:     Patient ID: Judy Boyle, female   DOB: [birth year], 23 y.o.   MRN: [id number]  HPI Comments: Pt here for PAP, not done at intake as not due.  Pt had been diagnosed with +GC 2/14 and had negative cutures here 7/14.  Pt is not sexually active since but is with same partner.   Pt also reports noticing a lump in her left mons area that comes out when she laughs hard or with valsalva.  Reports mass is about 3cm when present, no associated pain.  Pt denies any left lower quadrant pain.  Pt does lifting in retail job, lifts with both hands.  Pt does not go to gym    Review of Systems Per HPI    Objective:   Physical Exam  Constitutional: She appears well-developed and well-nourished.  Abdominal: Soft. She exhibits no distension and no mass. There is no tenderness. There is no rebound and no guarding. A hernia is present. Hernia confirmed positive in the left inguinal area (questionable, not appreciated on today's exam).  Genitourinary:     Lymphadenopathy:       Right: No inguinal adenopathy present.       Left: No inguinal adenopathy present.  Pelvic exam: VULVA: normal appearing vulva with no masses, tenderness or lesions, VAGINA: normal appearing vagina with normal color and discharge, no lesions, CERVIX: normal appearing cervix without discharge or lesions, cervical discharge present - white and frothy- pH 4.5, whiff negative UTERUS: uterus is normal size, shape, consistency and nontender, ADNEXA: normal adnexa in size, nontender and no masses.      Assessment:     PAP Questionable left inguinal hernia by history not appreciated on exam Frothy discharge     Plan:     PAP with reflex done today Suspect left inguinal hernia, will get Ct with contrast BMET today Pt informed if mass appears and does not reduce, needs to contact office immediately-understands

## 2013-02-14 NOTE — Patient Instructions (Signed)
Please call office if mass recurs and does not reduce. If after hours call and speak to MD on call Avoid heavy lifting

## 2013-02-15 LAB — IPS PAP TEST WITH REFLEX TO HPV

## 2013-02-17 ENCOUNTER — Telehealth: Payer: Self-pay | Admitting: *Deleted

## 2013-02-17 MED ORDER — FLUCONAZOLE 150 MG PO TABS: ORAL_TABLET | ORAL | Status: DC

## 2013-02-17 NOTE — Addendum Note (Signed)
Addended by: Lorraine LaxSHAW, Linah Klapper J on: 02/17/2013 09:44 AM   Modules accepted: Orders

## 2013-02-17 NOTE — Telephone Encounter (Signed)
Message copied by Lorraine LaxSHAW, Alta Goding J on Fri Feb 17, 2013  9:27 AM ------      Message from: Douglass RiversLATHROP, TRACY      Created: Wed Feb 15, 2013  5:18 PM       Inform PAP showed yeast. Ok to call in diflucan 150mg , repeat prn 3d #2, recall 2 ------

## 2013-02-17 NOTE — Telephone Encounter (Signed)
Left Message To Call Back  

## 2013-02-17 NOTE — Telephone Encounter (Signed)
Patient notified see labs 

## 2013-02-20 ENCOUNTER — Ambulatory Visit
Admission: RE | Admit: 2013-02-20 | Discharge: 2013-02-20 | Disposition: A | Discharge status: Home / Self Care | Payer: BC Managed Care – PPO | Source: Ambulatory Visit | Attending: Gynecology | Admitting: Gynecology

## 2013-02-20 DIAGNOSIS — R1900 Intra-abdominal and pelvic swelling, mass and lump, unspecified site: Secondary | ICD-10-CM

## 2013-02-20 MED ORDER — IOHEXOL 300 MG/ML  SOLN
100.0000 mL | Freq: Once | INTRAMUSCULAR | Status: AC | PRN
Start: 2013-02-20 — End: 2013-02-20
  Administered 2013-02-20: 100 mL via INTRAVENOUS

## 2013-02-21 ENCOUNTER — Telehealth: Payer: Self-pay | Admitting: *Deleted

## 2013-02-21 NOTE — Telephone Encounter (Signed)
Message copied by Lorraine LaxSHAW, Travin Marik J on Tue Feb 21, 2013  1:08 PM ------      Message from: Douglass RiversLATHROP, TRACY      Created: Tue Feb 21, 2013 12:48 PM       Inform ct looks good but recommend surgical evaluation or re-eval in office in 6923m?  [facility], can drop order if she want to go that route ------

## 2013-02-21 NOTE — Telephone Encounter (Signed)
Left Message To Call Back  

## 2013-02-21 NOTE — Telephone Encounter (Deleted)
Message copied by Lorraine LaxSHAW, Oryan Winterton J on Tue Feb 21, 2013  2:35 PM ------      Message from: Douglass RiversLATHROP, TRACY      Created: Tue Feb 21, 2013 12:48 PM       Inform ct looks good but recommend surgical evaluation or re-eval in office in 7743m?  [facility], can drop order if she want to go that route ------

## 2013-02-21 NOTE — Telephone Encounter (Signed)
Patient returning [name]'s call.  

## 2013-02-21 NOTE — Telephone Encounter (Signed)
Patient notified was calling patient back to schedule 2 month follow up  Left Message To Call Back

## 2013-02-21 NOTE — Telephone Encounter (Signed)
LM to call back.

## 2013-02-24 NOTE — Telephone Encounter (Signed)
Patient notified see labs 

## 2013-05-22 ENCOUNTER — Encounter: Payer: Self-pay | Admitting: Gynecology

## 2013-06-02 ENCOUNTER — Telehealth: Payer: Self-pay | Admitting: Gynecology

## 2013-06-02 DIAGNOSIS — B009 Herpesviral infection, unspecified: Secondary | ICD-10-CM

## 2013-06-02 DIAGNOSIS — R1909 Other intra-abdominal and pelvic swelling, mass and lump: Secondary | ICD-10-CM

## 2013-06-02 NOTE — Telephone Encounter (Signed)
Left message to call Welford Christmas at [phone]. 

## 2013-06-02 NOTE — Telephone Encounter (Signed)
Pt has questions regarding some symptoms she's having.

## 2013-06-05 NOTE — Telephone Encounter (Signed)
Left message to call Judy Boyle at [phone]. 

## 2013-06-07 NOTE — Telephone Encounter (Signed)
Spoke with patient. Patient states that she was seen by Dr.Lathrop in January for a lump on left mons. Patient states that she had a CT done that was "normal." Patient began to notice the lump again last week but states that is comes and goes and she does not currently have the lump anymore. Notices it more with activity. Denies pain associated with lump. Patient would like to know what Dr.Lathrop recommends that she do for the lump since it is not all the time and she previous had a CT. 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 Per office visit on 07/2012 patient preferred to be on daily Valtrex. Patient now wishes to be on Valtrex at onset of symptoms. Advised would send a message to Dr.Lathrop and give patient a call back with further instructions and advice. Patient agreeable.  Dr.Lathrop, how would you like patient to proceed with care for lump on left mons? Order pending for refill of Valtrex 1000MG  BID #[id number]. Patient is due for AEX 07/2013.

## 2013-06-08 MED ORDER — VALACYCLOVIR HCL 500 MG PO TABS: 500.0000 mg | ORAL_TABLET | Freq: Two times a day (BID) | ORAL | Status: DC

## 2013-06-08 NOTE — Telephone Encounter (Signed)
It sounded like a hernia, i know that the ct was neg, i can reach out to gen surg, she did not want to go that route before. Re HSV, 500mg  bid for 3d, i give them 30, which is 5 outbreaks, if they use more than that they need suppression

## 2013-06-08 NOTE — Telephone Encounter (Signed)
Left message to call Judy Boyle at [phone]. 

## 2013-06-14 NOTE — Addendum Note (Signed)
Addended by: Douglass Rivers on: 06/14/2013 02:40 PM   Modules accepted: Orders

## 2013-06-14 NOTE — Telephone Encounter (Signed)
Spoke with patient. Advised of message from Dr.Lathrop. Patient agreeable to use Valtrex 500mg  bid for 3 days with onset of symptoms. Advised if she is needing more than the 30 supplied we will need to look into daily dosage. Patient would like to have referral to general surgeon at this time. Advised would send a message to Dr.Lathrop so that referral can be placed and that our referral coordinator will proceed with getting that appointment scheduled. Patient agreeable and verbalizes understanding.  Routing to provider for final review. Patient agreeable to disposition. Will close encounter

## 2013-06-14 NOTE — Telephone Encounter (Signed)
Spoke with Dr Maisie Fus at [facility], can feel assured that she does not have hernia with normal CT, referral NI.  Pt should call us if mass recurs and does not reduce or if she develops pain

## 2013-06-14 NOTE — Telephone Encounter (Signed)
Referral to gs made

## 2013-06-15 NOTE — Telephone Encounter (Signed)
Spoke with pt to advise of Dr. Liliana Cline recommendation to call back if mass recurs or if she has pain. Advised that Dr. Maisie Fus did not think she had a hernia based on CT results, and that referral not needed. Pt agreeable and will call as needed.

## 2013-07-19 ENCOUNTER — Ambulatory Visit (INDEPENDENT_AMBULATORY_CARE_PROVIDER_SITE_OTHER): Payer: BC Managed Care – PPO | Admitting: Certified Nurse Midwife

## 2013-07-19 ENCOUNTER — Encounter: Payer: Self-pay | Admitting: Certified Nurse Midwife

## 2013-07-19 VITALS — BP 90/62 | HR 64 | Resp 16 | Ht 66.0 in | Wt 114.0 lb

## 2013-07-19 DIAGNOSIS — N390 Urinary tract infection, site not specified: Secondary | ICD-10-CM

## 2013-07-19 LAB — POCT URINALYSIS DIPSTICK
Bilirubin, UA: NEGATIVE
Glucose, UA: NEGATIVE
Ketones, UA: NEGATIVE
Nitrite, UA: NEGATIVE
Protein, UA: NEGATIVE
Urobilinogen, UA: NEGATIVE
pH, UA: 5

## 2013-07-19 MED ORDER — PHENAZOPYRIDINE HCL 200 MG PO TABS: 200.0000 mg | ORAL_TABLET | Freq: Three times a day (TID) | ORAL | Status: DC | PRN

## 2013-07-19 MED ORDER — NITROFURANTOIN MONOHYD MACRO 100 MG PO CAPS: 100.0000 mg | ORAL_CAPSULE | Freq: Two times a day (BID) | ORAL | Status: DC

## 2013-07-19 NOTE — Patient Instructions (Signed)

## 2013-07-19 NOTE — Progress Notes (Signed)
23 y.o. single african [place] g0p0 here with complaint of UTI, with onset  on 4 days ago Partner in with patient per patient request.. Patient complaining of urinary frequency/urgency/ and pain with urination and blood in urine. Patient denies fever, chills, nausea or back pain. Patient used new soap and had sexual activity prior to UTI   Denies any vaginal symptoms. Contraception is none. Patient interested in being back on contraception, but needs aex. Patient will schedule appointment and will use condoms until then.  O: Healthy female WDWN Affect: Normal, orientation x 3 Skin : warm and dry CVAT: negative bilateral Abdomen: positive for suprapubic tenderness  Pelvic exam: External genital area: normal, no lesions Bladder,Urethra, Urethral meatus: tender Vagina: normal vaginal discharge, normal appearance  Wet prep not taken Cervix: normal, non tender Uterus:normal,non tender Adnexa: normal non tender, no fullness or masses   A: UTI Poct urine-rbc 2+, wbc 2+ Contraception desired, needs aex  P: Reviewed findings of UTI ZO:[id number]:Macrobid see order Rx Pyridium see order UJW:JXBJYLab:Urine micro, culture Reviewed warning signs and symptoms of UTI Encouraged to limit soda, tea, and coffee Patient will schedule aex and will discuss options at that time  RV 2 week if TOC needed for positive culture Schedule aex for restart of contraception.  RV prn

## 2013-07-20 ENCOUNTER — Ambulatory Visit: Payer: BC Managed Care – PPO | Admitting: Certified Nurse Midwife

## 2013-07-20 LAB — URINALYSIS, MICROSCOPIC ONLY
Bacteria, UA: NONE SEEN
Crystals: NONE SEEN
Squamous Epithelial / LPF: NONE SEEN

## 2013-07-22 LAB — URINE CULTURE

## 2013-07-24 NOTE — Progress Notes (Signed)
Reviewed personally.  M. Suzanne Eward Rutigliano, MD.  

## 2013-07-25 ENCOUNTER — Telehealth: Payer: Self-pay

## 2013-07-25 NOTE — Telephone Encounter (Signed)
Patient returning Joy's call.  °

## 2013-07-25 NOTE — Telephone Encounter (Signed)
Message copied by Eliezer BottomJOHNSON, Fernando Torry J on Tue Jul 25, 2013 11:03 AM ------      Message from: Ria CommentGRUBB, PATRICIA R      Created: Mon Jul 24, 2013  6:06 PM       Let patient know that urine culture was positive and she is on the correct antibiotics.  She will need TOC and looks like AEX in 2 weeks per notes. ------

## 2013-07-25 NOTE — Telephone Encounter (Signed)
Patient notified of results. See lab 

## 2013-07-25 NOTE — Telephone Encounter (Signed)
lmtcb

## 2013-08-11 ENCOUNTER — Telehealth: Payer: Self-pay | Admitting: Certified Nurse Midwife

## 2013-08-11 ENCOUNTER — Ambulatory Visit: Payer: BC Managed Care – PPO | Admitting: Certified Nurse Midwife

## 2013-08-11 NOTE — Telephone Encounter (Signed)
Patient called this morning stating she is still sick and will not be able to make in to her appointment today. Patient says she will call later to reschedule. I did not charge dnka fee due to illness.

## 2013-09-20 ENCOUNTER — Encounter: Payer: Self-pay | Admitting: Certified Nurse Midwife

## 2013-09-20 ENCOUNTER — Ambulatory Visit (INDEPENDENT_AMBULATORY_CARE_PROVIDER_SITE_OTHER): Payer: BC Managed Care – PPO | Admitting: Certified Nurse Midwife

## 2013-09-20 ENCOUNTER — Ambulatory Visit: Payer: BC Managed Care – PPO | Admitting: Certified Nurse Midwife

## 2013-09-20 VITALS — BP 106/64 | HR 68 | Temp 98.4°F | Resp 16 | Ht 66.0 in | Wt 110.0 lb

## 2013-09-20 DIAGNOSIS — N390 Urinary tract infection, site not specified: Secondary | ICD-10-CM

## 2013-09-20 LAB — POCT URINALYSIS DIPSTICK
Bilirubin, UA: NEGATIVE
Blood, UA: NEGATIVE
Glucose, UA: NEGATIVE
Ketones, UA: NEGATIVE
Leukocytes, UA: NEGATIVE
NITRITE UA: POSITIVE
PROTEIN UA: NEGATIVE
UROBILINOGEN UA: NEGATIVE
pH, UA: 5

## 2013-09-20 MED ORDER — CIPROFLOXACIN HCL 500 MG PO TABS: 500.0000 mg | ORAL_TABLET | Freq: Two times a day (BID) | ORAL | Status: DC

## 2013-09-20 NOTE — Progress Notes (Signed)
23 yo single african [place] female g0p0 here with complaint of UTI, with onset  on 2-3 days. Patient treated for E coli UTI 7/15 and resolved.. Patient complaining of urinary frequency/urgency/ and pain with urination. Patient denies fever, chills, nausea.Slight back pain. No new personal products. Patient feels related to sexual activity. Denies any vaginal symptoms or new personal products. Contraception is withdrawal. Partner was unable to withdraw with last sexual activity, so patient took Plan B last night. Patient also interested in OCP use again. No other health issues today.  O: Healthy female WDWN Affect: Normal, orientation x 3 Skin : warm and dry CVAT: negative bilateral Abdomen: positive for suprapubic tenderness, soft  Pelvic exam: External genital area: normal, no lesions Bladder,Urethra, Urethral meatus: all tender Vagina: normal vaginal discharge, normal appearance  Wet prep Cervix: normal, non tender Uterus:normal,non tender Adnexa: normal non tender, no fullness or masses   A: UTI ? Post coital Poct urine-nitrite positive Contraception desired, period not due for 2 weeks No aex in past ?2 years  P: Reviewed findings of UTI of and association with sexual activity. Discussed emptying bladder before and after sexual activity. Increase water intake and cranberry juice or tablets daily. Does not use thongs. Encouraged to eat yogurt daily to avoid yeast infection with antibiotic use. UE:AVWUJ see order WJX:BJYNW micro, culture Reviewed warning signs and symptoms of UTI Encouraged to limit soda, tea, and coffee  Discussed scheduling aex with TOC and will be able to start on OCP if all normal. Patient agreeable, will schedule. Stressed condom use for contraception.  RV 2 weeks, prn

## 2013-09-20 NOTE — Patient Instructions (Signed)
Oral Contraception Use Oral contraceptive pills (OCPs) are medicines taken to prevent pregnancy. OCPs work by preventing the ovaries from releasing eggs. The hormones in OCPs also cause the cervical mucus to thicken, preventing the sperm from entering the uterus. The hormones also cause the uterine lining to become thin, not allowing a fertilized egg to attach to the inside of the uterus. OCPs are highly effective when taken exactly as prescribed. However, OCPs do not prevent sexually transmitted diseases (STDs). Safe sex practices, such as using condoms along with an OCP, can help prevent STDs. Before taking OCPs, you may have a physical exam and Pap test. Your health care provider may also order blood tests if necessary. Your health care provider will make sure you are a good candidate for oral contraception. Discuss with your health care provider the possible side effects of the OCP you may be prescribed. When starting an OCP, it can take 2 to 3 months for the body to adjust to the changes in hormone levels in your body.  HOW TO TAKE ORAL CONTRACEPTIVE PILLS Your health care provider may advise you on how to start taking the first cycle of OCPs. Otherwise, you can:   Start on day 1 of your menstrual period. You will not need any backup contraceptive protection with this start time.   Start on the first Sunday after your menstrual period or the day you get your prescription. In these cases, you will need to use backup contraceptive protection for the first week.   Start the pill at any time of your cycle. If you take the pill within 5 days of the start of your period, you are protected against pregnancy right away. In this case, you will not need a backup form of birth control. If you start at any other time of your menstrual cycle, you will need to use another form of birth control for 7 days. If your OCP is the type called a minipill, it will protect you from pregnancy after taking it for 2 days (48  hours). After you have started taking OCPs:   If you forget to take 1 pill, take it as soon as you remember. Take the next pill at the regular time.   If you miss 2 or more pills, call your health care provider because different pills have different instructions for missed doses. Use backup birth control until your next menstrual period starts.   If you use a 28-day pack that contains inactive pills and you miss 1 of the last 7 pills (pills with no hormones), it will not matter. Throw away the rest of the non-hormone pills and start a new pill pack.  No matter which day you start the OCP, you will always start a new pack on that same day of the week. Have an extra pack of OCPs and a backup contraceptive method available in case you miss some pills or lose your OCP pack.  HOME CARE INSTRUCTIONS   Do not smoke.   Always use a condom to protect against STDs. OCPs do not protect against STDs.   Use a calendar to mark your menstrual period days.   Read the information and directions that came with your OCP. Talk to your health care provider if you have questions.  SEEK MEDICAL CARE IF:   You develop nausea and vomiting.   You have abnormal vaginal discharge or bleeding.   You develop a rash.   You miss your menstrual period.   You are losing   your hair.   You need treatment for mood swings or depression.   You get dizzy when taking the OCP.   You develop acne from taking the OCP.   You become pregnant.  SEEK IMMEDIATE MEDICAL CARE IF:   You develop chest pain.   You develop shortness of breath.   You have an uncontrolled or severe headache.   You develop numbness or slurred speech.   You develop visual problems.   You develop pain, redness, and swelling in the legs.  Document Released: 12/25/2010 Document Revised: 05/22/2013 Document Reviewed: 06/26/2012 Exit[employer] Patient Information 2015 [employer]. This information is not intended to replace  advice given to you by your health care provider. Make sure you discuss any questions you have with your health care provider. Urinary Tract Infection Urinary tract infections (UTIs) can develop anywhere along your urinary tract. Your urinary tract is your body's drainage system for removing wastes and extra water. Your urinary tract includes two kidneys, two ureters, a bladder, and a urethra. Your kidneys are a pair of bean-shaped organs. Each kidney is about the size of your fist. They are located below your ribs, one on each side of your spine. CAUSES Infections are caused by microbes, which are microscopic organisms, including fungi, viruses, and bacteria. These organisms are so small that they can only be seen through a microscope. Bacteria are the microbes that most commonly cause UTIs. SYMPTOMS  Symptoms of UTIs may vary by age and gender of the patient and by the location of the infection. Symptoms in young women typically include a frequent and intense urge to urinate and a painful, burning feeling in the bladder or urethra during urination. Older women and men are more likely to be tired, shaky, and weak and have muscle aches and abdominal pain. A fever may mean the infection is in your kidneys. Other symptoms of a kidney infection include pain in your back or sides below the ribs, nausea, and vomiting. DIAGNOSIS To diagnose a UTI, your caregiver will ask you about your symptoms. Your caregiver also will ask to provide a urine sample. The urine sample will be tested for bacteria and white blood cells. White blood cells are made by your body to help fight infection. TREATMENT  Typically, UTIs can be treated with medication. Because most UTIs are caused by a bacterial infection, they usually can be treated with the use of antibiotics. The choice of antibiotic and length of treatment depend on your symptoms and the type of bacteria causing your infection. HOME CARE INSTRUCTIONS  If you were  prescribed antibiotics, take them exactly as your caregiver instructs you. Finish the medication even if you feel better after you have only taken some of the medication.  Drink enough water and fluids to keep your urine clear or pale yellow.  Avoid caffeine, tea, and carbonated beverages. They tend to irritate your bladder.  Empty your bladder often. Avoid holding urine for long periods of time.  Empty your bladder before and after sexual intercourse.  After a bowel movement, women should cleanse from front to back. Use each tissue only once. SEEK MEDICAL CARE IF:   You have back pain.  You develop a fever.  Your symptoms do not begin to resolve within 3 days. SEEK IMMEDIATE MEDICAL CARE IF:   You have severe back pain or lower abdominal pain.  You develop chills.  You have nausea or vomiting.  You have continued burning or discomfort with urination. MAKE SURE YOU:     Understand these instructions.  Will watch your condition.  Will get help right away if you are not doing well or get worse. Document Released: 10/15/2004 Document Revised: 07/07/2011 Document Reviewed: 02/13/2011 Exit[employer] Patient Information 2015 [employer]. This information is not intended to replace advice given to you by your health care provider. Make sure you discuss any questions you have with your health care provider.  

## 2013-09-20 NOTE — Progress Notes (Signed)
Reviewed personally.  M. Suzanne Angelus Hoopes, MD.  

## 2013-09-21 LAB — URINALYSIS, MICROSCOPIC ONLY
BACTERIA UA: NONE SEEN
CASTS: NONE SEEN
Crystals: NONE SEEN
Squamous Epithelial / LPF: NONE SEEN

## 2013-09-21 LAB — URINE CULTURE
COLONY COUNT: NO GROWTH
ORGANISM ID, BACTERIA: NO GROWTH

## 2013-10-03 ENCOUNTER — Ambulatory Visit: Payer: BC Managed Care – PPO | Admitting: Certified Nurse Midwife

## 2013-10-04 ENCOUNTER — Ambulatory Visit (INDEPENDENT_AMBULATORY_CARE_PROVIDER_SITE_OTHER): Payer: BC Managed Care – PPO | Admitting: Certified Nurse Midwife

## 2013-10-04 ENCOUNTER — Encounter: Payer: Self-pay | Admitting: Certified Nurse Midwife

## 2013-10-04 VITALS — BP 100/60 | HR 64 | Resp 16 | Ht 66.25 in | Wt 112.0 lb

## 2013-10-04 DIAGNOSIS — Z01419 Encounter for gynecological examination (general) (routine) without abnormal findings: Secondary | ICD-10-CM

## 2013-10-04 DIAGNOSIS — Z0000 Encounter for general adult medical examination without abnormal findings: Secondary | ICD-10-CM

## 2013-10-04 LAB — POCT URINALYSIS DIPSTICK
BILIRUBIN UA: NEGATIVE
Glucose, UA: NEGATIVE
Ketones, UA: NEGATIVE
LEUKOCYTES UA: NEGATIVE
NITRITE UA: NEGATIVE
Protein, UA: NEGATIVE
RBC UA: NEGATIVE
UROBILINOGEN UA: NEGATIVE
pH, UA: 5

## 2013-10-04 LAB — HEMOGLOBIN, FINGERSTICK: HEMOGLOBIN, FINGERSTICK: 13.2 g/dL (ref 12.0–16.0)

## 2013-10-04 NOTE — Patient Instructions (Addendum)
General topics  Next pap or exam is  due in 1 year Take a Women's multivitamin Take 1200 mg. of calcium daily - prefer dietary If any concerns in interim to call back  Breast Self-Awareness Practicing breast self-awareness may pick up problems early, prevent significant medical complications, and possibly save your life. By practicing breast self-awareness, you can become familiar with how your breasts look and feel and if your breasts are changing. This allows you to notice changes early. It can also offer you some reassurance that your breast health is good. One way to learn what is normal for your breasts and whether your breasts are changing is to do a breast self-exam. If you find a lump or something that was not present in the past, it is best to contact your caregiver right away. Other findings that should be evaluated by your caregiver include nipple discharge, especially if it is bloody; skin changes or reddening; areas where the skin seems to be pulled in (retracted); or new lumps and bumps. Breast pain is seldom associated with cancer (malignancy), but should also be evaluated by a caregiver. BREAST SELF-EXAM The best time to examine your breasts is 5 7 days after your menstrual period is over.  ExitCare Patient Information 2013 [employer].   Exercise to Stay Healthy Exercise helps you become and stay healthy. EXERCISE IDEAS AND TIPS Choose exercises that:  You enjoy.  Fit into your day. You do not need to exercise really hard to be healthy. You can do exercises at a slow or medium level and stay healthy. You can:  Stretch before and after working out.  Try yoga, Pilates, or tai chi.  Lift weights.  Walk fast, swim, jog, run, climb stairs, bicycle, dance, or rollerskate.  Take aerobic classes. Exercises that burn about 150 calories:  Running 1  miles in 15 minutes.  Playing volleyball for 45 to 60 minutes.  Washing and waxing a car for 45 to 60  minutes.  Playing touch football for 45 minutes.  Walking 1  miles in 35 minutes.  Pushing a stroller 1  miles in 30 minutes.  Playing basketball for 30 minutes.  Raking leaves for 30 minutes.  Bicycling 5 miles in 30 minutes.  Walking 2 miles in 30 minutes.  Dancing for 30 minutes.  Shoveling snow for 15 minutes.  Swimming laps for 20 minutes.  Walking up stairs for 15 minutes.  Bicycling 4 miles in 15 minutes.  Gardening for 30 to 45 minutes.  Jumping rope for 15 minutes.  Washing windows or floors for 45 to 60 minutes. Document Released: 02/07/2010 Document Revised: 03/30/2011 Document Reviewed: 02/07/2010 ExitCare Patient Information 2013 [employer].   Other topics ( that may be useful information):    Sexually Transmitted Disease Sexually transmitted disease (STD) refers to any infection that is passed from person to person during sexual activity. This may happen by way of saliva, semen, blood, vaginal mucus, or urine. Common STDs include:  Gonorrhea.  Chlamydia.  Syphilis.  HIV/AIDS.  Genital herpes.  Hepatitis B and C.  Trichomonas.  Human papillomavirus (HPV).  Pubic lice. CAUSES  An STD may be spread by bacteria, virus, or parasite. A person can get an STD by:  Sexual intercourse with an infected person.  Sharing sex toys with an infected person.  Sharing needles with an infected person.  Having intimate contact with the genitals, mouth, or rectal areas of an infected person. SYMPTOMS  Some people may not have any symptoms, but   they can still pass the infection to others. Different STDs have different symptoms. Symptoms include:  Painful or bloody urination.  Pain in the pelvis, abdomen, vagina, anus, throat, or eyes.  Skin rash, itching, irritation, growths, or sores (lesions). These usually occur in the genital or anal area.  Abnormal vaginal discharge.  Penile discharge in men.  Soft, flesh-colored skin growths in the  genital or anal area.  Fever.  Pain or bleeding during sexual intercourse.  Swollen glands in the groin area.  Yellow skin and eyes (jaundice). This is seen with hepatitis. DIAGNOSIS  To make a diagnosis, your caregiver may:  Take a medical history.  Perform a physical exam.  Take a specimen (culture) to be examined.  Examine a sample of discharge under a microscope.  Perform blood test TREATMENT   Chlamydia, gonorrhea, trichomonas, and syphilis can be cured with antibiotic medicine.  Genital herpes, hepatitis, and HIV can be treated, but not cured, with prescribed medicines. The medicines will lessen the symptoms.  Genital warts from HPV can be treated with medicine or by freezing, burning (electrocautery), or surgery. Warts may come back.  HPV is a virus and cannot be cured with medicine or surgery.However, abnormal areas may be followed very closely by your caregiver and may be removed from the cervix, vagina, or vulva through office procedures or surgery. If your diagnosis is confirmed, your recent sexual partners need treatment. This is true even if they are symptom-free or have a negative culture or evaluation. They should not have sex until their caregiver says it is okay. HOME CARE INSTRUCTIONS  All sexual partners should be informed, tested, and treated for all STDs.  Take your antibiotics as directed. Finish them even if you start to feel better.  Only take over-the-counter or prescription medicines for pain, discomfort, or fever as directed by your caregiver.  Rest.  Eat a balanced diet and drink enough fluids to keep your urine clear or pale yellow.  Do not have sex until treatment is completed and you have followed up with your caregiver. STDs should be checked after treatment.  Keep all follow-up appointments, Pap tests, and blood tests as directed by your caregiver.  Only use latex condoms and water-soluble lubricants during sexual activity. Do not use  petroleum jelly or oils.  Avoid alcohol and illegal drugs.  Get vaccinated for HPV and hepatitis. If you have not received these vaccines in the past, talk to your caregiver about whether one or both might be right for you.  Avoid risky sex practices that can break the skin. The only way to avoid getting an STD is to avoid all sexual activity.Latex condoms and dental dams (for oral sex) will help lessen the risk of getting an STD, but will not completely eliminate the risk. SEEK MEDICAL CARE IF:   You have a fever.  You have any new or worsening symptoms. Document Released: 03/28/2002 Document Revised: 03/30/2011 Document Reviewed: 04/04/2010 [facility] Patient Information 2013 [place].    Domestic Abuse You are being battered or abused if someone close to you hits, pushes, or physically hurts you in any way. You also are being abused if you are forced into activities. You are being sexually abused if you are forced to have sexual contact of any kind. You are being emotionally abused if you are made to feel worthless or if you are constantly threatened. It is important to remember that help is available. No one has the right to abuse you. PREVENTION OF FURTHER  ABUSE  Learn the warning signs of danger. This varies with situations but may include: the use of alcohol, threats, isolation from friends and family, or forced sexual contact. Leave if you feel that violence is going to occur.  If you are attacked or beaten, report it to the police so the abuse is documented. You do not have to press charges. The police can protect you while you or the attackers are leaving. Get the officer's name and badge number and a copy of the report.  Find someone you can trust and tell them what is happening to you: your caregiver, a nurse, clergy member, close friend or family member. Feeling ashamed is natural, but remember that you have done nothing wrong. No one deserves abuse. Document Released:  01/03/2000 Document Revised: 03/30/2011 Document Reviewed: 03/13/2010 ExitCare Patient Information 2013 [employer].    How Much is Too Much Alcohol? Drinking too much alcohol can cause injury, accidents, and health problems. These types of problems can include:   Car crashes.  Falls.  Family fighting (domestic violence).  Drowning.  Fights.  Injuries.  Burns.  Damage to certain organs.  Having a baby with birth defects. ONE DRINK CAN BE TOO MUCH WHEN YOU ARE:  Working.  Pregnant or breastfeeding.  Taking medicines. Ask your doctor.  Driving or planning to drive. If you or someone you know has a drinking problem, get help from a doctor.  Document Released: 11/01/2008 Document Revised: 03/30/2011 Document Reviewed: 11/01/2008 ExitCare Patient Information 2013 [employer].   Smoking Hazards Smoking cigarettes is extremely bad for your health. Tobacco smoke has over 200 known poisons in it. There are over 60 chemicals in tobacco smoke that cause cancer. Some of the chemicals found in cigarette smoke include:   Cyanide.  Benzene.  Formaldehyde.  Methanol (wood alcohol).  Acetylene (fuel used in welding torches).  Ammonia. Cigarette smoke also contains the poisonous gases nitrogen oxide and carbon monoxide.  Cigarette smokers have an increased risk of many serious medical problems and Smoking causes approximately:  90% of all lung cancer deaths in men.  80% of all lung cancer deaths in women.  90% of deaths from chronic obstructive lung disease. Compared with nonsmokers, smoking increases the risk of:  Coronary heart disease by 2 to 4 times.  Stroke by 2 to 4 times.  Men developing lung cancer by 23 times.  Women developing lung cancer by 13 times.  Dying from chronic obstructive lung diseases by 12 times.  . Smoking is the most preventable cause of death and disease in our society.  WHY IS SMOKING ADDICTIVE?  Nicotine is the chemical  agent in tobacco that is capable of causing addiction or dependence.  When you smoke and inhale, nicotine is absorbed rapidly into the bloodstream through your lungs. Nicotine absorbed through the lungs is capable of creating a powerful addiction. Both inhaled and non-inhaled nicotine may be addictive.  Addiction studies of cigarettes and spit tobacco show that addiction to nicotine occurs mainly during the teen years, when young people begin using tobacco products. WHAT ARE THE BENEFITS OF QUITTING?  There are many health benefits to quitting smoking.   Likelihood of developing cancer and heart disease decreases. Health improvements are seen almost immediately.  Blood pressure, pulse rate, and breathing patterns start returning to normal soon after quitting. QUITTING SMOKING   American Lung Association - 1-800-LUNGUSA  American Cancer Society - 1-800-ACS-2345 Document Released: 02/13/2004 Document Revised: 03/30/2011 Document Reviewed: 10/17/2008 ExitCare Patient Information 2013 ExitCare,   LLC.   Stress Management Stress is a state of physical or mental tension that often results from changes in your life or normal routine. Some common causes of stress are:  Death of a loved one.  Injuries or severe illnesses.  Getting fired or changing jobs.  Moving into a new home. Other causes may be:  Sexual problems.  Business or financial losses.  Taking on a large debt.  Regular conflict with someone at home or at work.  Constant tiredness from lack of sleep. It is not just bad things that are stressful. It may be stressful to:  Win the lottery.  Get married.  Buy a new car. The amount of stress that can be easily tolerated varies from person to person. Changes generally cause stress, regardless of the types of change. Too much stress can affect your health. It may lead to physical or emotional problems. Too little stress (boredom) may also become stressful. SUGGESTIONS TO  REDUCE STRESS:  Talk things over with your family and friends. It often is helpful to share your concerns and worries. If you feel your problem is serious, you may want to get help from a professional counselor.  Consider your problems one at a time instead of lumping them all together. Trying to take care of everything at once may seem impossible. List all the things you need to do and then start with the most important one. Set a goal to accomplish 2 or 3 things each day. If you expect to do too many in a single day you will naturally fail, causing you to feel even more stressed.  Do not use alcohol or drugs to relieve stress. Although you may feel better for a short time, they do not remove the problems that caused the stress. They can also be habit forming.  Exercise regularly - at least 3 times per week. Physical exercise can help to relieve that "uptight" feeling and will relax you.  The shortest distance between despair and hope is often a good night's sleep.  Go to bed and get up on time allowing yourself time for appointments without being rushed.  Take a short "time-out" period from any stressful situation that occurs during the day. Close your eyes and take some deep breaths. Starting with the muscles in your face, tense them, hold it for a few seconds, then relax. Repeat this with the muscles in your neck, shoulders, hand, stomach, back and legs.  Take good care of yourself. Eat a balanced diet and get plenty of rest.  Schedule time for having fun. Take a break from your daily routine to relax. HOME CARE INSTRUCTIONS   Call if you feel overwhelmed by your problems and feel you can no longer manage them on your own.  Return immediately if you feel like hurting yourself or someone else. Document Released: 07/01/2000 Document Revised: 03/30/2011 Document Reviewed: 02/21/2007 [facility] Patient Information 2013 [place].  Oral Contraception Use Oral contraceptive pills (OCPs)  are medicines taken to prevent pregnancy. OCPs work by preventing the ovaries from releasing eggs. The hormones in OCPs also cause the cervical mucus to thicken, preventing the sperm from entering the uterus. The hormones also cause the uterine lining to become thin, not allowing a fertilized egg to attach to the inside of the uterus. OCPs are highly effective when taken exactly as prescribed. However, OCPs do not prevent sexually transmitted diseases (STDs). Safe sex practices, such as using condoms along with an OCP, can help prevent STDs. Before  taking OCPs, you may have a physical exam and Pap test. Your health care provider may also order blood tests if necessary. Your health care provider will make sure you are a good candidate for oral contraception. Discuss with your health care provider the possible side effects of the OCP you may be prescribed. When starting an OCP, it can take 2 to 3 months for the body to adjust to the changes in hormone levels in your body.  HOW TO TAKE ORAL CONTRACEPTIVE PILLS Your health care provider may advise you on how to start taking the first cycle of OCPs. Otherwise, you can:   Start on day 1 of your menstrual period. You will not need any backup contraceptive protection with this start time.   Start on the first Sunday after your menstrual period or the day you get your prescription. In these cases, you will need to use backup contraceptive protection for the first week.   Start the pill at any time of your cycle. If you take the pill within 5 days of the start of your period, you are protected against pregnancy right away. In this case, you will not need a backup form of birth control. If you start at any other time of your menstrual cycle, you will need to use another form of birth control for 7 days. If your OCP is the type called a minipill, it will protect you from pregnancy after taking it for 2 days (48 hours). After you have started taking OCPs:   If you  forget to take 1 pill, take it as soon as you remember. Take the next pill at the regular time.   If you miss 2 or more pills, call your health care provider because different pills have different instructions for missed doses. Use backup birth control until your next menstrual period starts.   If you use a 28-day pack that contains inactive pills and you miss 1 of the last 7 pills (pills with no hormones), it will not matter. Throw away the rest of the non-hormone pills and start a new pill pack.  No matter which day you start the OCP, you will always start a new pack on that same day of the week. Have an extra pack of OCPs and a backup contraceptive method available in case you miss some pills or lose your OCP pack.  HOME CARE INSTRUCTIONS   Do not smoke.   Always use a condom to protect against STDs. OCPs do not protect against STDs.   Use a calendar to mark your menstrual period days.   Read the information and directions that came with your OCP. Talk to your health care provider if you have questions.  SEEK MEDICAL CARE IF:   You develop nausea and vomiting.   You have abnormal vaginal discharge or bleeding.   You develop a rash.   You miss your menstrual period.   You are losing your hair.   You need treatment for mood swings or depression.   You get dizzy when taking the OCP.   You develop acne from taking the OCP.   You become pregnant.  SEEK IMMEDIATE MEDICAL CARE IF:   You develop chest pain.   You develop shortness of breath.   You have an uncontrolled or severe headache.   You develop numbness or slurred speech.   You develop visual problems.   You develop pain, redness, and swelling in the legs.  Document Released: 12/25/2010 Document Revised: 05/22/2013 Document Reviewed:  06/26/2012 ExitCare Patient Information 2015 [place], [place]. This information is not intended to replace advice given to you by your health care provider. Make  sure you discuss any questions you have with your health care provider.      General topics  Next pap or exam is  due in 1 year Take a Women's multivitamin Take 1200 mg. of calcium daily - prefer dietary If any concerns in interim to call back  Breast Self-Awareness Practicing breast self-awareness may pick up problems early, prevent significant medical complications, and possibly save your life. By practicing breast self-awareness, you can become familiar with how your breasts look and feel and if your breasts are changing. This allows you to notice changes early. It can also offer you some reassurance that your breast health is good. One way to learn what is normal for your breasts and whether your breasts are changing is to do a breast self-exam. If you find a lump or something that was not present in the past, it is best to contact your caregiver right away. Other findings that should be evaluated by your caregiver include nipple discharge, especially if it is bloody; skin changes or reddening; areas where the skin seems to be pulled in (retracted); or new lumps and bumps. Breast pain is seldom associated with cancer (malignancy), but should also be evaluated by a caregiver. BREAST SELF-EXAM The best time to examine your breasts is 5 7 days after your menstrual period is over.  ExitCare Patient Information 2013 [place].   Exercise to Stay Healthy Exercise helps you become and stay healthy. EXERCISE IDEAS AND TIPS Choose exercises that:  You enjoy.  Fit into your day. You do not need to exercise really hard to be healthy. You can do exercises at a slow or medium level and stay healthy. You can:  Stretch before and after working out.  Try yoga, Pilates, or tai chi.  Lift weights.  Walk fast, swim, jog, run, climb stairs, bicycle, dance, or rollerskate.  Take aerobic classes. Exercises that burn about 150 calories:  Running 1  miles in 15 minutes.  Playing volleyball  for 45 to 60 minutes.  Washing and waxing a car for 45 to 60 minutes.  Playing touch football for 45 minutes.  Walking 1  miles in 35 minutes.  Pushing a stroller 1  miles in 30 minutes.  Playing basketball for 30 minutes.  Raking leaves for 30 minutes.  Bicycling 5 miles in 30 minutes.  Walking 2 miles in 30 minutes.  Dancing for 30 minutes.  Shoveling snow for 15 minutes.  Swimming laps for 20 minutes.  Walking up stairs for 15 minutes.  Bicycling 4 miles in 15 minutes.  Gardening for 30 to 45 minutes.  Jumping rope for 15 minutes.  Washing windows or floors for 45 to 60 minutes. Document Released: 02/07/2010 Document Revised: 03/30/2011 Document Reviewed: 02/07/2010 [facility] Patient Information 2013 [place].   Other topics ( that may be useful information):    Sexually Transmitted Disease Sexually transmitted disease (STD) refers to any infection that is passed from person to person during sexual activity. This may happen by way of saliva, semen, blood, vaginal mucus, or urine. Common STDs include:  Gonorrhea.  Chlamydia.  Syphilis.  HIV/AIDS.  Genital herpes.  Hepatitis B and C.  Trichomonas.  Human papillomavirus (HPV).  Pubic lice. CAUSES  An STD may be spread by bacteria, virus, or parasite. A person can get an STD by:  Sexual intercourse with an  infected person.  Sharing sex toys with an infected person.  Sharing needles with an infected person.  Having intimate contact with the genitals, mouth, or rectal areas of an infected person. SYMPTOMS  Some people may not have any symptoms, but they can still pass the infection to others. Different STDs have different symptoms. Symptoms include:  Painful or bloody urination.  Pain in the pelvis, abdomen, vagina, anus, throat, or eyes.  Skin rash, itching, irritation, growths, or sores (lesions). These usually occur in the genital or anal area.  Abnormal vaginal  discharge.  Penile discharge in men.  Soft, flesh-colored skin growths in the genital or anal area.  Fever.  Pain or bleeding during sexual intercourse.  Swollen glands in the groin area.  Yellow skin and eyes (jaundice). This is seen with hepatitis. DIAGNOSIS  To make a diagnosis, your caregiver may:  Take a medical history.  Perform a physical exam.  Take a specimen (culture) to be examined.  Examine a sample of discharge under a microscope.  Perform blood test TREATMENT   Chlamydia, gonorrhea, trichomonas, and syphilis can be cured with antibiotic medicine.  Genital herpes, hepatitis, and HIV can be treated, but not cured, with prescribed medicines. The medicines will lessen the symptoms.  Genital warts from HPV can be treated with medicine or by freezing, burning (electrocautery), or surgery. Warts may come back.  HPV is a virus and cannot be cured with medicine or surgery.However, abnormal areas may be followed very closely by your caregiver and may be removed from the cervix, vagina, or vulva through office procedures or surgery. If your diagnosis is confirmed, your recent sexual partners need treatment. This is true even if they are symptom-free or have a negative culture or evaluation. They should not have sex until their caregiver says it is okay. HOME CARE INSTRUCTIONS  All sexual partners should be informed, tested, and treated for all STDs.  Take your antibiotics as directed. Finish them even if you start to feel better.  Only take over-the-counter or prescription medicines for pain, discomfort, or fever as directed by your caregiver.  Rest.  Eat a balanced diet and drink enough fluids to keep your urine clear or pale yellow.  Do not have sex until treatment is completed and you have followed up with your caregiver. STDs should be checked after treatment.  Keep all follow-up appointments, Pap tests, and blood tests as directed by your caregiver.  Only  use latex condoms and water-soluble lubricants during sexual activity. Do not use petroleum jelly or oils.  Avoid alcohol and illegal drugs.  Get vaccinated for HPV and hepatitis. If you have not received these vaccines in the past, talk to your caregiver about whether one or both might be right for you.  Avoid risky sex practices that can break the skin. The only way to avoid getting an STD is to avoid all sexual activity.Latex condoms and dental dams (for oral sex) will help lessen the risk of getting an STD, but will not completely eliminate the risk. SEEK MEDICAL CARE IF:   You have a fever.  You have any new or worsening symptoms. Document Released: 03/28/2002 Document Revised: 03/30/2011 Document Reviewed: 04/04/2010 [facility] Patient Information 2013 [place], [place].    Domestic Abuse You are being battered or abused if someone close to you hits, pushes, or physically hurts you in any way. You also are being abused if you are forced into activities. You are being sexually abused if you are forced to have sexual  contact of any kind. You are being emotionally abused if you are made to feel worthless or if you are constantly threatened. It is important to remember that help is available. No one has the right to abuse you. PREVENTION OF FURTHER ABUSE  Learn the warning signs of danger. This varies with situations but may include: the use of alcohol, threats, isolation from friends and family, or forced sexual contact. Leave if you feel that violence is going to occur.  If you are attacked or beaten, report it to the police so the abuse is documented. You do not have to press charges. The police can protect you while you or the attackers are leaving. Get the officer's name and badge number and a copy of the report.  Find someone you can trust and tell them what is happening to you: your caregiver, a nurse, clergy member, close friend or family member. Feeling ashamed is natural, but  remember that you have done nothing wrong. No one deserves abuse. Document Released: 01/03/2000 Document Revised: 03/30/2011 Document Reviewed: 03/13/2010 [facility] Patient Information 2013 [place].    How Much is Too Much Alcohol? Drinking too much alcohol can cause injury, accidents, and health problems. These types of problems can include:   Car crashes.  Falls.  Family fighting (domestic violence).  Drowning.  Fights.  Injuries.  Burns.  Damage to certain organs.  Having a baby with birth defects. ONE DRINK CAN BE TOO MUCH WHEN YOU ARE:  Working.  Pregnant or breastfeeding.  Taking medicines. Ask your doctor.  Driving or planning to drive. If you or someone you know has a drinking problem, get help from a doctor.  Document Released: 11/01/2008 Document Revised: 03/30/2011 Document Reviewed: 11/01/2008 [facility] Patient Information 2013 [place].   Smoking Hazards Smoking cigarettes is extremely bad for your health. Tobacco smoke has over 200 known poisons in it. There are over 60 chemicals in tobacco smoke that cause cancer. Some of the chemicals found in cigarette smoke include:   Cyanide.  Benzene.  Formaldehyde.  Methanol (wood alcohol).  Acetylene (fuel used in welding torches).  Ammonia. Cigarette smoke also contains the poisonous gases nitrogen oxide and carbon monoxide.  Cigarette smokers have an increased risk of many serious medical problems and Smoking causes approximately:  90% of all lung cancer deaths in men.  80% of all lung cancer deaths in women.  90% of deaths from chronic obstructive lung disease. Compared with nonsmokers, smoking increases the risk of:  Coronary heart disease by 2 to 4 times.  Stroke by 2 to 4 times.  Men developing lung cancer by 23 times.  Women developing lung cancer by 13 times.  Dying from chronic obstructive lung diseases by 12 times.  . Smoking is the most preventable cause of death  and disease in our society.  WHY IS SMOKING ADDICTIVE?  Nicotine is the chemical agent in tobacco that is capable of causing addiction or dependence.  When you smoke and inhale, nicotine is absorbed rapidly into the bloodstream through your lungs. Nicotine absorbed through the lungs is capable of creating a powerful addiction. Both inhaled and non-inhaled nicotine may be addictive.  Addiction studies of cigarettes and spit tobacco show that addiction to nicotine occurs mainly during the teen years, when young people begin using tobacco products. WHAT ARE THE BENEFITS OF QUITTING?  There are many health benefits to quitting smoking.   Likelihood of developing cancer and heart disease decreases. Health improvements are seen almost immediately.  Blood  pressure, pulse rate, and breathing patterns start returning to normal soon after quitting. QUITTING SMOKING   American Lung Association - 1-800-LUNGUSA  American Cancer Society - 1-800-ACS-2345 Document Released: 02/13/2004 Document Revised: 03/30/2011 Document Reviewed: 10/17/2008 [facility] Patient Information 2013 [place].   Stress Management Stress is a state of physical or mental tension that often results from changes in your life or normal routine. Some common causes of stress are:  Death of a loved one.  Injuries or severe illnesses.  Getting fired or changing jobs.  Moving into a new home. Other causes may be:  Sexual problems.  Business or financial losses.  Taking on a large debt.  Regular conflict with someone at home or at work.  Constant tiredness from lack of sleep. It is not just bad things that are stressful. It may be stressful to:  Win the lottery.  Get married.  Buy a new car. The amount of stress that can be easily tolerated varies from person to person. Changes generally cause stress, regardless of the types of change. Too much stress can affect your health. It may lead to physical or  emotional problems. Too little stress (boredom) may also become stressful. SUGGESTIONS TO REDUCE STRESS:  Talk things over with your family and friends. It often is helpful to share your concerns and worries. If you feel your problem is serious, you may want to get help from a professional counselor.  Consider your problems one at a time instead of lumping them all together. Trying to take care of everything at once may seem impossible. List all the things you need to do and then start with the most important one. Set a goal to accomplish 2 or 3 things each day. If you expect to do too many in a single day you will naturally fail, causing you to feel even more stressed.  Do not use alcohol or drugs to relieve stress. Although you may feel better for a short time, they do not remove the problems that caused the stress. They can also be habit forming.  Exercise regularly - at least 3 times per week. Physical exercise can help to relieve that "uptight" feeling and will relax you.  The shortest distance between despair and hope is often a good night's sleep.  Go to bed and get up on time allowing yourself time for appointments without being rushed.  Take a short "time-out" period from any stressful situation that occurs during the day. Close your eyes and take some deep breaths. Starting with the muscles in your face, tense them, hold it for a few seconds, then relax. Repeat this with the muscles in your neck, shoulders, hand, stomach, back and legs.  Take good care of yourself. Eat a balanced diet and get plenty of rest.  Schedule time for having fun. Take a break from your daily routine to relax. HOME CARE INSTRUCTIONS   Call if you feel overwhelmed by your problems and feel you can no longer manage them on your own.  Return immediately if you feel like hurting yourself or someone else. Document Released: 07/01/2000 Document Revised: 03/30/2011 Document Reviewed: 02/21/2007 [facility] Patient  Information 2013 [place].

## 2013-10-04 NOTE — Progress Notes (Signed)
23 y.o. G0P0 Single African American Fe here for annual exam. Periods normal no issues. No STD concerns or screening needed. Patient completed all medication from UTI treated on 09/20/13, denies any urinary symptoms today. Sees PCP prn. No history of HSV outbreak, no suppression use. Patient would like to restart OCP, has used before with out problems. No other health issues today.  Patient's last menstrual period was 09/26/2013.          Sexually active: Yes.    The current method of family planning is condoms sometimes.    Exercising: Yes.    run & gym Smoker:  no  Health Maintenance: Pap: 02-14-13 neg Mmg. benign Colonoscopy:  none BMD:   none TDaP:  2009 Labs: Poct urine-neg, Hgb-13.2 Self breast exam: not done   reports that she has never smoked. She does not have any smokeless tobacco history on file. She reports that she drinks about one ounce of alcohol per week. She reports that she does not use illicit drugs.  Past Medical History  Diagnosis Date  . PID (acute pelvic inflammatory disease)   . Gonorrhea 2/14    Past Surgical History  Procedure Laterality Date  . Wisdom tooth extraction  6/14    No current outpatient prescriptions on file.   No current facility-administered medications for this visit.    Family History  Problem Relation Age of Onset  . Diabetes Maternal Uncle   . Breast cancer Maternal Grandmother     ROS:  Pertinent items are noted in HPI.  Otherwise, a comprehensive ROS was negative.  Exam:   BP 100/60  Pulse 64  Resp 16  Ht 5' 6.25" (1.683 m)  Wt 112 lb (50.803 kg)  BMI 17.94 kg/m2  LMP 09/26/2013 Height: 5' 6.25" (168.3 cm)  Ht Readings from Last 3 Encounters:  10/04/13 5' 6.25" (1.683 m)  09/20/13  (1.676 m)  07/19/13  (1.676 m)    General appearance: alert, cooperative and appears stated age Head: Normocephalic, without obvious abnormality, atraumatic Neck: no adenopathy, supple, symmetrical, trachea midline and thyroid  normal to inspection and palpation Lungs: clear to auscultation bilaterally Breasts: normal appearance, no masses or tenderness, No nipple retraction or dimpling, No nipple discharge or bleeding, No axillary or supraclavicular adenopathy Heart: regular rate and rhythm Abdomen: soft, non-tender; no masses,  no organomegaly Extremities: extremities normal, atraumatic, no cyanosis or edema Skin: Skin color, texture, turgor normal. No rashes or lesions Lymph nodes: Cervical, supraclavicular, and axillary nodes normal. No abnormal inguinal nodes palpated Neurologic: Grossly normal   Pelvic: External genitalia:  no lesions              Urethra:  normal appearing urethra with no masses, tenderness or lesions              Bartholin's and Skene's: normal                 Vagina: normal appearing vagina with normal color and discharge, no lesions              Cervix: normal, non tender, no lesions              Pap taken: No. Bimanual Exam:  Uterus:  normal size, contour, position, consistency, mobility, non-tender and anteflexed              Adnexa: normal adnexa and no mass, fullness, tenderness               Rectovaginal: Confirms  Anus:  normal sphincter tone, no lesions  A:  Well Woman with normal exam  Contraception OCP desired  P:   Reviewed health and wellness pertinent to exam  Rx Loestrin 24 Fe see order  Pap smear not taken today   counseled on breast self exam, STD prevention, HIV risk factors and prevention, adequate intake of calcium and vitamin D, diet and exercise  return annually or prn  An After Visit Summary was printed and given to the patient.

## 2013-10-09 NOTE — Progress Notes (Signed)
Reviewed personally.  M. Suzanne Lelah Rennaker, MD.  

## 2015-09-06 ENCOUNTER — Ambulatory Visit (INDEPENDENT_AMBULATORY_CARE_PROVIDER_SITE_OTHER): Payer: Managed Care, Other (non HMO) | Admitting: Certified Nurse Midwife

## 2015-09-06 ENCOUNTER — Encounter: Payer: Self-pay | Admitting: Certified Nurse Midwife

## 2015-09-06 VITALS — BP 120/70 | HR 68 | Resp 16 | Ht 65.75 in | Wt 116.0 lb

## 2015-09-06 DIAGNOSIS — Z0000 Encounter for general adult medical examination without abnormal findings: Secondary | ICD-10-CM | POA: Diagnosis not present

## 2015-09-06 DIAGNOSIS — Z01419 Encounter for gynecological examination (general) (routine) without abnormal findings: Secondary | ICD-10-CM

## 2015-09-06 LAB — POCT URINALYSIS DIPSTICK
Bilirubin, UA: NEGATIVE
Blood, UA: NEGATIVE
Glucose, UA: NEGATIVE
KETONES UA: NEGATIVE
LEUKOCYTES UA: NEGATIVE
Nitrite, UA: NEGATIVE
Protein, UA: NEGATIVE
UROBILINOGEN UA: NEGATIVE
pH, UA: 6

## 2015-09-06 LAB — HIV ANTIBODY (ROUTINE TESTING W REFLEX): HIV 1&2 Ab, 4th Generation: NONREACTIVE

## 2015-09-06 LAB — HEMOGLOBIN, FINGERSTICK: HEMOGLOBIN, FINGERSTICK: 12.6 g/dL (ref 12.0–16.0)

## 2015-09-06 NOTE — Progress Notes (Signed)
25 y.o. G0P0 Single  African American Fe here for annual exam. Periods normal, cramping relieved with OTC medication. Having some digestion issues with bloating after dairy use. Sees Urgent care if needed. Would like information on contraception options and STD screening today. No other health concerns. Considering Nursing profession!  Patient's last menstrual period was 08/24/2015 (exact date).          Sexually active: No.  The current method of family planning is abstinence.    Exercising: No.  exercise Smoker:  no  Health Maintenance: Pap:  02-14-13 neg MMG:  Not done Colonoscopy:  none BMD:   none TDaP:  2009 Shingles: no Pneumonia: no Hep C and HIV: Hep c neg 2014, HIV neg 2014 Labs: poct urine-ph 6, hgb-12.6 Self breast exam: done occ   reports that she has never smoked. She does not have any smokeless tobacco history on file. She reports that she drinks about 1.0 oz of alcohol per week . She reports that she does not use drugs.  Past Medical History:  Diagnosis Date  . Gonorrhea 2/14  . PID (acute pelvic inflammatory disease)     Past Surgical History:  Procedure Laterality Date  . WISDOM TOOTH EXTRACTION  6/14    No current outpatient prescriptions on file.   No current facility-administered medications for this visit.     Family History  Problem Relation Age of Onset  . Diabetes Maternal Uncle   . Breast cancer Maternal Grandmother     ROS:  Pertinent items are noted in HPI.  Otherwise, a comprehensive ROS was negative.  Exam:   BP 120/70   Pulse 68   Resp 16   Ht 5' 5.75" (1.67 m)   Wt 116 lb (52.6 kg)   LMP 08/24/2015 (Exact Date)   BMI 18.87 kg/m  Height: 5' 5.75" (167 cm) Ht Readings from Last 3 Encounters:  09/06/15 5' 5.75" (1.67 m)  10/04/13 5' 6.25" (1.683 m)  09/20/13 5\' 6"  (1.676 m)    General appearance: alert, cooperative and appears stated age Head: Normocephalic, without obvious abnormality, atraumatic Neck: no adenopathy, supple,  symmetrical, trachea midline and thyroid normal to inspection and palpation Lungs: clear to auscultation bilaterally Breasts: normal appearance, no masses or tenderness, No nipple retraction or dimpling, No nipple discharge or bleeding, No axillary or supraclavicular adenopathy Heart: regular rate and rhythm Abdomen: soft, non-tender; no masses,  no organomegaly Extremities: extremities normal, atraumatic, no cyanosis or edema Skin: Skin color, texture, turgor normal. No rashes or lesions Lymph nodes: Cervical, supraclavicular, and axillary nodes normal. No abnormal inguinal nodes palpated Neurologic: Grossly normal   Pelvic: External genitalia:  no lesions              Urethra:  normal appearing urethra with no masses, tenderness or lesions              Bartholin's and Skene's: normal                 Vagina: normal appearing vagina with normal color and discharge, no lesions              Cervix: no cervical motion tenderness, no lesions and nulliparous appearance              Pap taken: No. Bimanual Exam:  Uterus:  normal size, contour, position, consistency, mobility, non-tender              Adnexa: normal adnexa and no mass, fullness, tenderness  Rectovaginal: Confirms               Anus:  normal appearance   Chaperone present: yes  A:  Well Woman with normal exam  Contraception abstain, considering options  STD screening    P:   Reviewed health and wellness pertinent to exam  Discussed contraception options and given information on. Patient will review information and on CDC website and will call to discuss further if needed.  Lab: GC,Chlamydia, HIV,RPR,affirm  Pap smear as above not taken   counseled on breast self exam, STD prevention, HIV risk factors and prevention, family planning choices, adequate intake of calcium and vitamin D, diet and exercise  return annually or prn  An After Visit Summary was printed and given to the patient.

## 2015-09-06 NOTE — Patient Instructions (Signed)
General topics  Next pap or exam is  due in 1 year Take a Women's multivitamin Take 1200 mg. of calcium daily - prefer dietary If any concerns in interim to call back  Breast Self-Awareness Practicing breast self-awareness may pick up problems early, prevent significant medical complications, and possibly save your life. By practicing breast self-awareness, you can become familiar with how your breasts look and feel and if your breasts are changing. This allows you to notice changes early. It can also offer you some reassurance that your breast health is good. One way to learn what is normal for your breasts and whether your breasts are changing is to do a breast self-exam. If you find a lump or something that was not present in the past, it is best to contact your caregiver right away. Other findings that should be evaluated by your caregiver include nipple discharge, especially if it is bloody; skin changes or reddening; areas where the skin seems to be pulled in (retracted); or new lumps and bumps. Breast pain is seldom associated with cancer (malignancy), but should also be evaluated by a caregiver. BREAST SELF-EXAM The best time to examine your breasts is 5 7 days after your menstrual period is over.  ExitCare Patient Information 2013 [employer].   Exercise to Stay Healthy Exercise helps you become and stay healthy. EXERCISE IDEAS AND TIPS Choose exercises that:  You enjoy.  Fit into your day. You do not need to exercise really hard to be healthy. You can do exercises at a slow or medium level and stay healthy. You can:  Stretch before and after working out.  Try yoga, Pilates, or tai chi.  Lift weights.  Walk fast, swim, jog, run, climb stairs, bicycle, dance, or rollerskate.  Take aerobic classes. Exercises that burn about 150 calories:  Running 1  miles in 15 minutes.  Playing volleyball for 45 to 60 minutes.  Washing and waxing a car for 45 to 60  minutes.  Playing touch football for 45 minutes.  Walking 1  miles in 35 minutes.  Pushing a stroller 1  miles in 30 minutes.  Playing basketball for 30 minutes.  Raking leaves for 30 minutes.  Bicycling 5 miles in 30 minutes.  Walking 2 miles in 30 minutes.  Dancing for 30 minutes.  Shoveling snow for 15 minutes.  Swimming laps for 20 minutes.  Walking up stairs for 15 minutes.  Bicycling 4 miles in 15 minutes.  Gardening for 30 to 45 minutes.  Jumping rope for 15 minutes.  Washing windows or floors for 45 to 60 minutes. Document Released: 02/07/2010 Document Revised: 03/30/2011 Document Reviewed: 02/07/2010 ExitCare Patient Information 2013 ExitCare[employer].   Other topics ( that may be useful information):    Sexually Transmitted Disease Sexually transmitted disease (STD) refers to any infection that is passed from person to person during sexual activity. This may happen by way of saliva, semen, blood, vaginal mucus, or urine. Common STDs include:  Gonorrhea.  Chlamydia.  Syphilis.  HIV/AIDS.  Genital herpes.  Hepatitis B and C.  Trichomonas.  Human papillomavirus (HPV).  Pubic lice. CAUSES  An STD may be spread by bacteria, virus, or parasite. A person can get an STD by:  Sexual intercourse with an infected person.  Sharing sex toys with an infected person.  Sharing needles with an infected person.  Having intimate contact with the genitals, mouth, or rectal areas of an infected person. SYMPTOMS  Some people may not have any symptoms, but   they can still pass the infection to others. Different STDs have different symptoms. Symptoms include:  Painful or bloody urination.  Pain in the pelvis, abdomen, vagina, anus, throat, or eyes.  Skin rash, itching, irritation, growths, or sores (lesions). These usually occur in the genital or anal area.  Abnormal vaginal discharge.  Penile discharge in men.  Soft, flesh-colored skin growths in the  genital or anal area.  Fever.  Pain or bleeding during sexual intercourse.  Swollen glands in the groin area.  Yellow skin and eyes (jaundice). This is seen with hepatitis. DIAGNOSIS  To make a diagnosis, your caregiver may:  Take a medical history.  Perform a physical exam.  Take a specimen (culture) to be examined.  Examine a sample of discharge under a microscope.  Perform blood test TREATMENT   Chlamydia, gonorrhea, trichomonas, and syphilis can be cured with antibiotic medicine.  Genital herpes, hepatitis, and HIV can be treated, but not cured, with prescribed medicines. The medicines will lessen the symptoms.  Genital warts from HPV can be treated with medicine or by freezing, burning (electrocautery), or surgery. Warts may come back.  HPV is a virus and cannot be cured with medicine or surgery.However, abnormal areas may be followed very closely by your caregiver and may be removed from the cervix, vagina, or vulva through office procedures or surgery. If your diagnosis is confirmed, your recent sexual partners need treatment. This is true even if they are symptom-free or have a negative culture or evaluation. They should not have sex until their caregiver says it is okay. HOME CARE INSTRUCTIONS  All sexual partners should be informed, tested, and treated for all STDs.  Take your antibiotics as directed. Finish them even if you start to feel better.  Only take over-the-counter or prescription medicines for pain, discomfort, or fever as directed by your caregiver.  Rest.  Eat a balanced diet and drink enough fluids to keep your urine clear or pale yellow.  Do not have sex until treatment is completed and you have followed up with your caregiver. STDs should be checked after treatment.  Keep all follow-up appointments, Pap tests, and blood tests as directed by your caregiver.  Only use latex condoms and water-soluble lubricants during sexual activity. Do not use  petroleum jelly or oils.  Avoid alcohol and illegal drugs.  Get vaccinated for HPV and hepatitis. If you have not received these vaccines in the past, talk to your caregiver about whether one or both might be right for you.  Avoid risky sex practices that can break the skin. The only way to avoid getting an STD is to avoid all sexual activity.Latex condoms and dental dams (for oral sex) will help lessen the risk of getting an STD, but will not completely eliminate the risk. SEEK MEDICAL CARE IF:   You have a fever.  You have any new or worsening symptoms. Document Released: 03/28/2002 Document Revised: 03/30/2011 Document Reviewed: 04/04/2010 [facility] Patient Information 2013 [employer].    Domestic Abuse You are being battered or abused if someone close to you hits, pushes, or physically hurts you in any way. You also are being abused if you are forced into activities. You are being sexually abused if you are forced to have sexual contact of any kind. You are being emotionally abused if you are made to feel worthless or if you are constantly threatened. It is important to remember that help is available. No one has the right to abuse you. PREVENTION OF FURTHER  ABUSE  Learn the warning signs of danger. This varies with situations but may include: the use of alcohol, threats, isolation from friends and family, or forced sexual contact. Leave if you feel that violence is going to occur.  If you are attacked or beaten, report it to the police so the abuse is documented. You do not have to press charges. The police can protect you while you or the attackers are leaving. Get the officer's name and badge number and a copy of the report.  Find someone you can trust and tell them what is happening to you: your caregiver, a nurse, clergy member, close friend or family member. Feeling ashamed is natural, but remember that you have done nothing wrong. No one deserves abuse. Document Released:  01/03/2000 Document Revised: 03/30/2011 Document Reviewed: 03/13/2010 ExitCare Patient Information 2013 [employer]Care[employer].    How Much is Too Much Alcohol? Drinking too much alcohol can cause injury, accidents, and health problems. These types of problems can include:   Car crashes.  Falls.  Family fighting (domestic violence).  Drowning.  Fights.  Injuries.  Burns.  Damage to certain organs.  Having a baby with birth defects. ONE DRINK CAN BE TOO MUCH WHEN YOU ARE:  Working.  Pregnant or breastfeeding.  Taking medicines. Ask your doctor.  Driving or planning to drive. If you or someone you know has a drinking problem, get help from a doctor.  Document Released: 11/01/2008 Document Revised: 03/30/2011 Document Reviewed: 11/01/2008 ExitCare Patient Information 2013 ExitCare[employer].   Smoking Hazards Smoking cigarettes is extremely bad for your health. Tobacco smoke has over 200 known poisons in it. There are over 60 chemicals in tobacco smoke that cause cancer. Some of the chemicals found in cigarette smoke include:   Cyanide.  Benzene.  Formaldehyde.  Methanol (wood alcohol).  Acetylene (fuel used in welding torches).  Ammonia. Cigarette smoke also contains the poisonous gases nitrogen oxide and carbon monoxide.  Cigarette smokers have an increased risk of many serious medical problems and Smoking causes approximately:  90% of all lung cancer deaths in men.  80% of all lung cancer deaths in women.  90% of deaths from chronic obstructive lung disease. Compared with nonsmokers, smoking increases the risk of:  Coronary heart disease by 2 to 4 times.  Stroke by 2 to 4 times.  Men developing lung cancer by 23 times.  Women developing lung cancer by 13 times.  Dying from chronic obstructive lung diseases by 12 times.  . Smoking is the most preventable cause of death and disease in our society.  WHY IS SMOKING ADDICTIVE?  Nicotine is the chemical  agent in tobacco that is capable of causing addiction or dependence.  When you smoke and inhale, nicotine is absorbed rapidly into the bloodstream through your lungs. Nicotine absorbed through the lungs is capable of creating a powerful addiction. Both inhaled and non-inhaled nicotine may be addictive.  Addiction studies of cigarettes and spit tobacco show that addiction to nicotine occurs mainly during the teen years, when young people begin using tobacco products. WHAT ARE THE BENEFITS OF QUITTING?  There are many health benefits to quitting smoking.   Likelihood of developing cancer and heart disease decreases. Health improvements are seen almost immediately.  Blood pressure, pulse rate, and breathing patterns start returning to normal soon after quitting. QUITTING SMOKING   American Lung Association - 1-800-LUNGUSA  American Cancer Society - 1-800-ACS-2345 Document Released: 02/13/2004 Document Revised: 03/30/2011 Document Reviewed: 10/17/2008 ExitCare Patient Information 2013 ExitCare,   LLC.   Stress Management Stress is a state of physical or mental tension that often results from changes in your life or normal routine. Some common causes of stress are:  Death of a loved one.  Injuries or severe illnesses.  Getting fired or changing jobs.  Moving into a new home. Other causes may be:  Sexual problems.  Business or financial losses.  Taking on a large debt.  Regular conflict with someone at home or at work.  Constant tiredness from lack of sleep. It is not just bad things that are stressful. It may be stressful to:  Win the lottery.  Get married.  Buy a new car. The amount of stress that can be easily tolerated varies from person to person. Changes generally cause stress, regardless of the types of change. Too much stress can affect your health. It may lead to physical or emotional problems. Too little stress (boredom) may also become stressful. SUGGESTIONS TO  REDUCE STRESS:  Talk things over with your family and friends. It often is helpful to share your concerns and worries. If you feel your problem is serious, you may want to get help from a professional counselor.  Consider your problems one at a time instead of lumping them all together. Trying to take care of everything at once may seem impossible. List all the things you need to do and then start with the most important one. Set a goal to accomplish 2 or 3 things each day. If you expect to do too many in a single day you will naturally fail, causing you to feel even more stressed.  Do not use alcohol or drugs to relieve stress. Although you may feel better for a short time, they do not remove the problems that caused the stress. They can also be habit forming.  Exercise regularly - at least 3 times per week. Physical exercise can help to relieve that "uptight" feeling and will relax you.  The shortest distance between despair and hope is often a good night's sleep.  Go to bed and get up on time allowing yourself time for appointments without being rushed.  Take a short "time-out" period from any stressful situation that occurs during the day. Close your eyes and take some deep breaths. Starting with the muscles in your face, tense them, hold it for a few seconds, then relax. Repeat this with the muscles in your neck, shoulders, hand, stomach, back and legs.  Take good care of yourself. Eat a balanced diet and get plenty of rest.  Schedule time for having fun. Take a break from your daily routine to relax. HOME CARE INSTRUCTIONS   Call if you feel overwhelmed by your problems and feel you can no longer manage them on your own.  Return immediately if you feel like hurting yourself or someone else. Document Released: 07/01/2000 Document Revised: 03/30/2011 Document Reviewed: 02/21/2007 ExitCare Patient Information 2013 ExitCare, LLC.   

## 2015-09-07 LAB — RPR

## 2015-09-07 LAB — WET PREP BY MOLECULAR PROBE
Candida species: NEGATIVE
Gardnerella vaginalis: POSITIVE — AB
TRICHOMONAS VAG: NEGATIVE

## 2015-09-07 NOTE — Progress Notes (Signed)
Encounter reviewed Jill Jertson, MD   

## 2015-09-09 ENCOUNTER — Other Ambulatory Visit: Payer: Self-pay | Admitting: Certified Nurse Midwife

## 2015-09-09 ENCOUNTER — Telehealth: Payer: Self-pay | Admitting: Certified Nurse Midwife

## 2015-09-09 DIAGNOSIS — B9689 Other specified bacterial agents as the cause of diseases classified elsewhere: Secondary | ICD-10-CM

## 2015-09-09 DIAGNOSIS — N760 Acute vaginitis: Principal | ICD-10-CM

## 2015-09-09 MED ORDER — METRONIDAZOLE 0.75 % VA GEL: 1.0000 | Freq: Two times a day (BID) | VAGINAL | 0 refills | Status: DC

## 2015-09-09 NOTE — Telephone Encounter (Signed)
Patient was last seen 09/06/15 and the pharmacy informed her a prescription had been sent in for her. Patient has some questions about this prescription.

## 2015-09-09 NOTE — Telephone Encounter (Signed)
Called patient and informed her of lab results and answered all her questions. She voiced understanding -eh

## 2015-09-10 LAB — IPS N GONORRHOEA AND CHLAMYDIA BY PCR

## 2016-11-05 ENCOUNTER — Ambulatory Visit: Payer: Managed Care, Other (non HMO) | Admitting: Certified Nurse Midwife

## 2016-11-18 ENCOUNTER — Encounter: Payer: Self-pay | Admitting: Obstetrics and Gynecology

## 2016-11-18 ENCOUNTER — Other Ambulatory Visit (HOSPITAL_COMMUNITY)
Admission: RE | Admit: 2016-11-18 | Discharge: 2016-11-18 | Disposition: A | Discharge status: Home / Self Care | Payer: Medicaid Other | Source: Ambulatory Visit | Attending: Obstetrics and Gynecology | Admitting: Obstetrics and Gynecology

## 2016-11-18 ENCOUNTER — Ambulatory Visit (INDEPENDENT_AMBULATORY_CARE_PROVIDER_SITE_OTHER): Payer: Medicaid Other | Admitting: Obstetrics and Gynecology

## 2016-11-18 VITALS — BP 110/72 | HR 77 | Wt 124.0 lb

## 2016-11-18 DIAGNOSIS — B009 Herpesviral infection, unspecified: Secondary | ICD-10-CM

## 2016-11-18 DIAGNOSIS — Z3400 Encounter for supervision of normal first pregnancy, unspecified trimester: Secondary | ICD-10-CM | POA: Diagnosis not present

## 2016-11-18 DIAGNOSIS — Z3401 Encounter for supervision of normal first pregnancy, first trimester: Secondary | ICD-10-CM | POA: Diagnosis not present

## 2016-11-18 NOTE — Progress Notes (Signed)
INITIAL PRENATAL VISIT NOTE  Subjective:  Judy Boyle is a 26 y.o. G1P0 at [redacted]w[redacted]d by LMP c/w US done at [redacted]w[redacted]d at [facility], (patient had images on phone, IUP with dating noted) being seen today for her initial prenatal visit. This is a unplanned pregnancy. She and partner are happy with the pregnancy. She was using nothing for birth control previously. She has a medical history significant for HSV, last outbreak > 1 yr ago. Otherwise healthy.  Patient reports no complaints.  Contractions: Not present. Vag. Bleeding: None.   . Denies leaking of fluid.   Past Medical History:  Diagnosis Date  . Gonorrhea 2/14  . PID (acute pelvic inflammatory disease)     Past Surgical History:  Procedure Laterality Date  . WISDOM TOOTH EXTRACTION  6/14    OB History  Gravida Para Term Preterm AB Living  1 0          SAB TAB Ectopic Multiple Live Births               # Outcome Date GA Lbr Len/2nd Weight Sex Delivery Anes PTL Lv  1 Current               Social History   Social History  . Marital status: Single    Spouse name: N/A  . Number of children: N/A  . Years of education: N/A   Social History Main Topics  . Smoking status: Never Smoker  . Smokeless tobacco: Never Used  . Alcohol use 1.0 oz/week    2 Standard drinks or equivalent per week  . Drug use: No  . Sexual activity: Yes    Partners: Male    Birth control/ protection: None   Other Topics Concern  . None   Social History Narrative  . None    Family History  Problem Relation Age of Onset  . Diabetes Maternal Uncle   . Breast cancer Maternal Grandmother      (Not in a hospital admission)  No Known Allergies  Review of Systems: Negative except for what is mentioned in HPI.  Objective:   Vitals:   11/18/16 1451  BP: 110/72  Pulse: 77  Weight: 124 lb (56.2 kg)    Fetal Status: Fetal Heart Rate (bpm): 156         Physical Exam: BP 110/72   Pulse 77   Wt 124 lb (56.2 kg)    LMP 08/19/2016   BMI 20.17 kg/m  CONSTITUTIONAL: Well-developed, well-nourished female in no acute distress.  NEUROLOGIC: Alert and oriented to person, place, and time. Normal reflexes, muscle tone coordination. No cranial nerve deficit noted.  PSYCHIATRIC: Normal mood and affect. Normal behavior. Normal judgment and thought content. SKIN: Skin is warm and dry. No rash noted. Not diaphoretic. No erythema. No pallor. HENT:  Normocephalic, atraumatic, External right and left ear normal. Oropharynx is clear and moist EYES: Conjunctivae and EOM are normal. Pupils are equal, round, and reactive to light. No scleral icterus.  NECK: Normal range of motion, supple, no masses CARDIOVASCULAR: Normal heart rate noted, regular rhythm RESPIRATORY: Effort and breath sounds normal, no problems with respiration noted BREASTS: symmetric, non-tender, no masses palpable ABDOMEN: Soft, nontender, nondistended, gravid. GU: normal appearing external female genitalia, nulliparous normal appearing cervix, scant white discharge in vagina, no lesions noted Bimanual: 10 weeks sized uterus, no adnexal tenderness or palpable lesions noted MUSCULOSKELETAL: Normal range of motion. EXT:  No edema and no tenderness. 2+ distal pulses.  Assessment and Plan:  Pregnancy: G1P0 at 6625w0d by LMP c/w 1st trim US  1. Supervision of normal first pregnancy, [facility] - Culture, OB Urine - Hemoglobinopathy evaluation - Obstetric Panel, Including HIV - Cystic Fibrosis Mutation 97 - Cytology - PAP - Varicella zoster antibody, IgG - Babyscripts Schedule Optimization - Spinal Muscular Atrophy Carrier - US MFM OB COMP + 14 WK; Future panorama  2. HSV (herpes simplex virus) infection No recent outbreaks To start valtrex 36 weeks   Preterm labor symptoms and general obstetric precautions including but not limited to vaginal bleeding, contractions, leaking of fluid and fetal  movement were reviewed in detail with the patient.  Please refer to After Visit Summary for other counseling recommendations.   Return in about 7 weeks (around 01/06/2017) for OB visit.  Conan BowensKelly M Davis 11/18/2016 3:31 PM

## 2016-11-18 NOTE — Patient Instructions (Signed)

## 2016-11-18 NOTE — Addendum Note (Signed)
Addended by: Dalphine HandingGARDNER, Justyne Roell L on: 11/18/2016 04:32 PM   Modules accepted: Orders

## 2016-11-20 LAB — URINE CULTURE, OB REFLEX

## 2016-11-20 LAB — CYTOLOGY - PAP
Bacterial vaginitis: NEGATIVE
CANDIDA VAGINITIS: NEGATIVE
CHLAMYDIA, DNA PROBE: NEGATIVE
DIAGNOSIS: NEGATIVE
Neisseria Gonorrhea: NEGATIVE
Trichomonas: NEGATIVE

## 2016-11-20 LAB — CULTURE, OB URINE

## 2016-11-21 LAB — OBSTETRIC PANEL, INCLUDING HIV
ANTIBODY SCREEN: NEGATIVE
BASOS: 0 %
Basophils Absolute: 0 10*3/uL (ref 0.0–0.2)
EOS (ABSOLUTE): 0 10*3/uL (ref 0.0–0.4)
EOS: 0 %
HEMATOCRIT: 35.9 % (ref 34.0–46.6)
HIV SCREEN 4TH GENERATION: NONREACTIVE
Hemoglobin: 11.8 g/dL (ref 11.1–15.9)
Hepatitis B Surface Ag: NEGATIVE
Immature Grans (Abs): 0 10*3/uL (ref 0.0–0.1)
Immature Granulocytes: 0 %
Lymphocytes Absolute: 1.4 10*3/uL (ref 0.7–3.1)
Lymphs: 23 %
MCH: 28.8 pg (ref 26.6–33.0)
MCHC: 32.9 g/dL (ref 31.5–35.7)
MCV: 88 fL (ref 79–97)
MONOCYTES: 10 %
Monocytes Absolute: 0.6 10*3/uL (ref 0.1–0.9)
NEUTROS ABS: 4.2 10*3/uL (ref 1.4–7.0)
Neutrophils: 67 %
Platelets: 324 10*3/uL (ref 150–379)
RBC: 4.1 x10E6/uL (ref 3.77–5.28)
RDW: 13.5 % (ref 12.3–15.4)
RH TYPE: POSITIVE
RPR: NONREACTIVE
RUBELLA: 6.1 {index} (ref >0.99)
WBC: 6.3 10*3/uL (ref 3.4–10.8)

## 2016-11-21 LAB — HEMOGLOBINOPATHY EVALUATION
HGB C: 0 %
HGB S: 0 %
HGB VARIANT: 0 %
Hemoglobin A2 Quantitation: 2.6 % (ref 1.8–3.2)
Hemoglobin F Quantitation: 0 % (ref 0.0–2.0)
Hgb A: 97.4 % (ref 96.4–98.8)

## 2016-11-21 LAB — VARICELLA ZOSTER ANTIBODY, IGG: Varicella zoster IgG: 867 index (ref >165)

## 2016-11-25 LAB — CYSTIC FIBROSIS MUTATION 97: Interpretation: NOT DETECTED

## 2016-11-26 LAB — SMN1 COPY NUMBER ANALYSIS (SMA CARRIER SCREENING)

## 2016-12-14 ENCOUNTER — Ambulatory Visit (INDEPENDENT_AMBULATORY_CARE_PROVIDER_SITE_OTHER): Payer: Medicaid Other | Admitting: Obstetrics and Gynecology

## 2016-12-14 ENCOUNTER — Encounter: Payer: Self-pay | Admitting: Obstetrics and Gynecology

## 2016-12-14 VITALS — BP 105/69 | HR 78 | Wt 126.8 lb

## 2016-12-14 DIAGNOSIS — Z3400 Encounter for supervision of normal first pregnancy, unspecified trimester: Secondary | ICD-10-CM

## 2016-12-14 DIAGNOSIS — Z8619 Personal history of other infectious and parasitic diseases: Secondary | ICD-10-CM

## 2016-12-14 NOTE — Progress Notes (Signed)
Subjective:  Judy Boyle is a 26 y.o. G1P0 at 4539w5d being seen today for ongoing prenatal care.  She is currently monitored for the following issues for this low-risk pregnancy and has Supervision of normal first pregnancy, antepartum and History of ELISA positive for HSV on their problem list.  Patient reports no complaints.  Contractions: Not present. Vag. Bleeding: None.   . Denies leaking of fluid.   The following portions of the patient's history were reviewed and updated as appropriate: allergies, current medications, past family history, past medical history, past social history, past surgical history and problem list. Problem list updated.  Objective:   Vitals:   12/14/16 1429  BP: 105/69  Pulse: 78  Weight: 126 lb 12.8 oz (57.5 kg)    Fetal Status: Fetal Heart Rate (bpm): 150          General:  Alert, oriented and cooperative. Patient is in no acute distress.  Skin: Skin is warm and dry. No rash noted.   Cardiovascular: Normal heart rate noted  Respiratory: Normal respiratory effort, no problems with respiration noted  Abdomen: Soft, gravid, appropriate for gestational age. Pain/Pressure: Absent     Pelvic:  Cervical exam deferred        Extremities: Normal range of motion.  Edema: None  Mental Status: Normal mood and affect. Normal behavior. Normal judgment and thought content.   Urinalysis:      Assessment and Plan:  Pregnancy: G1P0 at 2839w5d  1. Supervision of normal first pregnancy, antepartum Stable - US OB Comp + 14 Wk; Future - AFP, Serum, Open Spina Bifida  2. History of ELISA positive for HSV Prophylactic tx starting at 36 weeks  Preterm labor symptoms and general obstetric precautions including but not limited to vaginal bleeding, contractions, leaking of fluid and fetal movement were reviewed in detail with the patient. Please refer to After Visit Summary for other counseling recommendations.  Return in about 4 weeks (around 01/11/2017) for OB  visit.   Hermina StaggersErvin, Michael L, MD

## 2016-12-14 NOTE — Patient Instructions (Signed)

## 2016-12-15 ENCOUNTER — Encounter: Payer: Self-pay | Admitting: *Deleted

## 2016-12-16 ENCOUNTER — Encounter: Payer: Self-pay | Admitting: *Deleted

## 2016-12-21 ENCOUNTER — Encounter (HOSPITAL_COMMUNITY): Payer: Self-pay | Admitting: Obstetrics and Gynecology

## 2016-12-24 LAB — AFP, SERUM, OPEN SPINA BIFIDA
AFP MOM: 2.64
AFP Value: 111.9 ng/mL
GEST. AGE ON COLLECTION DATE: 16.5 wk
Maternal Age At EDD: 28.1 yr
OSBR RISK 1 IN: 442
TEST RESULTS AFP: NEGATIVE
Weight: 126 [lb_av]

## 2016-12-28 ENCOUNTER — Ambulatory Visit (HOSPITAL_COMMUNITY): Payer: Medicaid Other | Attending: Obstetrics and Gynecology

## 2017-01-04 ENCOUNTER — Ambulatory Visit (HOSPITAL_COMMUNITY)
Admission: RE | Admit: 2017-01-04 | Discharge: 2017-01-04 | Disposition: A | Discharge status: Home / Self Care | Payer: Medicaid Other | Source: Ambulatory Visit | Attending: Obstetrics and Gynecology | Admitting: Obstetrics and Gynecology

## 2017-01-04 ENCOUNTER — Other Ambulatory Visit: Payer: Self-pay | Admitting: Obstetrics and Gynecology

## 2017-01-04 DIAGNOSIS — Z3A19 19 weeks gestation of pregnancy: Secondary | ICD-10-CM

## 2017-01-04 DIAGNOSIS — Z369 Encounter for antenatal screening, unspecified: Secondary | ICD-10-CM

## 2017-01-04 DIAGNOSIS — Z3482 Encounter for supervision of other normal pregnancy, second trimester: Secondary | ICD-10-CM | POA: Diagnosis not present

## 2017-01-04 DIAGNOSIS — Z3689 Encounter for other specified antenatal screening: Secondary | ICD-10-CM | POA: Insufficient documentation

## 2017-01-04 DIAGNOSIS — Z3400 Encounter for supervision of normal first pregnancy, unspecified trimester: Secondary | ICD-10-CM

## 2017-01-19 NOTE — L&D Delivery Note (Signed)
Delivery Note After about a 30 minute 2nd stage, with intermittent prolonged bradycardia, At 12:31 AM a viable female was delivered via Vaginal, Spontaneous (Presentation: ;ROA  ). By Dr. Jolayne Pantheronstant.  APGAR: , ; weight  .After 1 minute, the cord was clamped and cut. 40 units of pitocin diluted in 1000cc LR was infused rapidly IV.  The placenta separated spontaneously and delivered via CCT and maternal pushing effort.  It was inspected and appears to be intact with a 3 VC.   Foley left in for the night d/t packing   Anesthesia:  epidural Episiotomy:  none Lacerations:  2nd degree.  Site kept oozing so packing was placed.  Suture Repair: 2.0 vicryl Est. Blood Loss (mL):  450 Mom to postpartum.  Baby to Couplet care / Skin to Skin.  Judy Boyle 05/14/2017, 12:45 AM

## 2017-01-26 ENCOUNTER — Ambulatory Visit (INDEPENDENT_AMBULATORY_CARE_PROVIDER_SITE_OTHER): Payer: Medicaid Other | Admitting: Obstetrics and Gynecology

## 2017-01-26 ENCOUNTER — Encounter: Payer: Self-pay | Admitting: Obstetrics and Gynecology

## 2017-01-26 VITALS — BP 111/68 | HR 80 | Wt 129.0 lb

## 2017-01-26 DIAGNOSIS — Z3402 Encounter for supervision of normal first pregnancy, second trimester: Secondary | ICD-10-CM

## 2017-01-26 DIAGNOSIS — Z3400 Encounter for supervision of normal first pregnancy, unspecified trimester: Secondary | ICD-10-CM

## 2017-01-26 DIAGNOSIS — Z8619 Personal history of other infectious and parasitic diseases: Secondary | ICD-10-CM

## 2017-01-26 NOTE — Patient Instructions (Signed)

## 2017-01-26 NOTE — Progress Notes (Signed)
Subjective:  Judy Boyle is a 27 y.o. G1P0 at 7420w6d being seen today for ongoing prenatal care.  She is currently monitored for the following issues for this low-risk pregnancy and has Supervision of normal first pregnancy, antepartum and History of ELISA positive for HSV on their problem list.  Patient reports no complaints.  Contractions: Not present. Vag. Bleeding: None.   . Denies leaking of fluid.   The following portions of the patient's history were reviewed and updated as appropriate: allergies, current medications, past family history, past medical history, past social history, past surgical history and problem list. Problem list updated.  Objective:   Vitals:   01/26/17 1452  BP: 111/68  Pulse: 80  Weight: 129 lb (58.5 kg)    Fetal Status: Fetal Heart Rate (bpm): 156         General:  Alert, oriented and cooperative. Patient is in no acute distress.  Skin: Skin is warm and dry. No rash noted.   Cardiovascular: Normal heart rate noted  Respiratory: Normal respiratory effort, no problems with respiration noted  Abdomen: Soft, gravid, appropriate for gestational age. Pain/Pressure: Present     Pelvic:  Cervical exam deferred        Extremities: Normal range of motion.  Edema: None  Mental Status: Normal mood and affect. Normal behavior. Normal judgment and thought content.   Urinalysis:      Assessment and Plan:  Pregnancy: G1P0 at 5320w6d  1. Supervision of normal first pregnancy, antepartum Satble Glucola next visit  2. History of ELISA positive for HSV Tx at 36 weeks  Preterm labor symptoms and general obstetric precautions including but not limited to vaginal bleeding, contractions, leaking of fluid and fetal movement were reviewed in detail with the patient. Please refer to After Visit Summary for other counseling recommendations.  Return in about 4 weeks (around 02/23/2017) for OB visit.   Hermina StaggersErvin, Seri Kimmer L, MD

## 2017-01-26 NOTE — Progress Notes (Signed)
Pt states she has noticed when sneezing she has a sharp pain .

## 2017-02-23 ENCOUNTER — Ambulatory Visit (INDEPENDENT_AMBULATORY_CARE_PROVIDER_SITE_OTHER): Payer: Medicaid Other | Admitting: Obstetrics and Gynecology

## 2017-02-23 ENCOUNTER — Encounter: Payer: Self-pay | Admitting: Obstetrics and Gynecology

## 2017-02-23 ENCOUNTER — Other Ambulatory Visit: Payer: Medicaid Other

## 2017-02-23 VITALS — BP 108/69 | HR 92 | Wt 134.9 lb

## 2017-02-23 DIAGNOSIS — Z3400 Encounter for supervision of normal first pregnancy, unspecified trimester: Secondary | ICD-10-CM

## 2017-02-23 DIAGNOSIS — Z3402 Encounter for supervision of normal first pregnancy, second trimester: Secondary | ICD-10-CM

## 2017-02-23 DIAGNOSIS — Z8619 Personal history of other infectious and parasitic diseases: Secondary | ICD-10-CM

## 2017-02-23 NOTE — Progress Notes (Signed)
Subjective:  Judy Boyle is a 27 y.o. G1P0 at 6962w6d being seen today for ongoing prenatal care.  She is currently monitored for the following issues for this low-risk pregnancy and has Supervision of normal first pregnancy, antepartum and History of ELISA positive for HSV on their problem list.  Patient reports no complaints.  Contractions: Not present. Vag. Bleeding: None.  Movement: Present. Denies leaking of fluid.   The following portions of the patient's history were reviewed and updated as appropriate: allergies, current medications, past family history, past medical history, past social history, past surgical history and problem list. Problem list updated.  Objective:   Vitals:   02/23/17 0837  BP: 108/69  Pulse: 92  Weight: 134 lb 14.4 oz (61.2 kg)    Fetal Status: Fetal Heart Rate (bpm): 148   Movement: Present     General:  Alert, oriented and cooperative. Patient is in no acute distress.  Skin: Skin is warm and dry. No rash noted.   Cardiovascular: Normal heart rate noted  Respiratory: Normal respiratory effort, no problems with respiration noted  Abdomen: Soft, gravid, appropriate for gestational age. Pain/Pressure: Present     Pelvic:  Cervical exam deferred        Extremities: Normal range of motion.  Edema: None  Mental Status: Normal mood and affect. Normal behavior. Normal judgment and thought content.   Urinalysis:      Assessment and Plan:  Pregnancy: G1P0 at 3062w6d  1. Supervision of normal first pregnancy, antepartum Stable Glucola and labs today Undecided about Tdap Information provided  2. History of ELISA positive for HSV Tx at 36 weeks  Preterm labor symptoms and general obstetric precautions including but not limited to vaginal bleeding, contractions, leaking of fluid and fetal movement were reviewed in detail with the patient. Please refer to After Visit Summary for other counseling recommendations.  Return in about 3 weeks (around 03/16/2017)  for OB visit.   Hermina StaggersErvin, Jaedan Huttner L, MD

## 2017-02-23 NOTE — Patient Instructions (Signed)
Third Trimester of Pregnancy The third trimester is from week 28 through week 40 (months 7 through 9). The third trimester is a time when the unborn baby (fetus) is growing rapidly. At the end of the ninth month, the fetus is about 20 inches in length and weighs 6-10 pounds. Body changes during your third trimester Your body will continue to go through many changes during pregnancy. The changes vary from woman to woman. During the third trimester:  Your weight will continue to increase. You can expect to gain 25-35 pounds (11-16 kg) by the end of the pregnancy.  You may begin to get stretch marks on your hips, abdomen, and breasts.  You may urinate more often because the fetus is moving lower into your pelvis and pressing on your bladder.  You may develop or continue to have heartburn. This is caused by increased hormones that slow down muscles in the digestive tract.  You may develop or continue to have constipation because increased hormones slow digestion and cause the muscles that push waste through your intestines to relax.  You may develop hemorrhoids. These are swollen veins (varicose veins) in the rectum that can itch or be painful.  You may develop swollen, bulging veins (varicose veins) in your legs.  You may have increased body aches in the pelvis, back, or thighs. This is due to weight gain and increased hormones that are relaxing your joints.  You may have changes in your hair. These can include thickening of your hair, rapid growth, and changes in texture. Some women also have hair loss during or after pregnancy, or hair that feels dry or thin. Your hair will most likely return to normal after your baby is born.  Your breasts will continue to grow and they will continue to become tender. A yellow fluid (colostrum) may leak from your breasts. This is the first milk you are producing for your baby.  Your belly button may stick out.  You may notice more swelling in your hands,  face, or ankles.  You may have increased tingling or numbness in your hands, arms, and legs. The skin on your belly may also feel numb.  You may feel short of breath because of your expanding uterus.  You may have more problems sleeping. This can be caused by the size of your belly, increased need to urinate, and an increase in your body's metabolism.  You may notice the fetus "dropping," or moving lower in your abdomen (lightening).  You may have increased vaginal discharge.  You may notice your joints feel loose and you may have pain around your pelvic bone.  What to expect at prenatal visits You will have prenatal exams every 2 weeks until week 36. Then you will have weekly prenatal exams. During a routine prenatal visit:  You will be weighed to make sure you and the baby are growing normally.  Your blood pressure will be taken.  Your abdomen will be measured to track your baby's growth.  The fetal heartbeat will be listened to.  Any test results from the previous visit will be discussed.  You may have a cervical check near your due date to see if your cervix has softened or thinned (effaced).  You will be tested for Group B streptococcus. This happens between 35 and 37 weeks.  Your health care provider may ask you:  What your birth plan is.  How you are feeling.  If you are feeling the baby move.  If you have had   any abnormal symptoms, such as leaking fluid, bleeding, severe headaches, or abdominal cramping.  If you are using any tobacco products, including cigarettes, chewing tobacco, and electronic cigarettes.  If you have any questions.  Other tests or screenings that may be performed during your third trimester include:  Blood tests that check for low iron levels (anemia).  Fetal testing to check the health, activity level, and growth of the fetus. Testing is done if you have certain medical conditions or if there are problems during the  pregnancy.  Nonstress test (NST). This test checks the health of your baby to make sure there are no signs of problems, such as the baby not getting enough oxygen. During this test, a belt is placed around your belly. The baby is made to move, and its heart rate is monitored during movement.  What is false labor? False labor is a condition in which you feel small, irregular tightenings of the muscles in the womb (contractions) that usually go away with rest, changing position, or drinking water. These are called Braxton Hicks contractions. Contractions may last for hours, days, or even weeks before true labor sets in. If contractions come at regular intervals, become more frequent, increase in intensity, or become painful, you should see your health care provider. What are the signs of labor?  Abdominal cramps.  Regular contractions that start at 10 minutes apart and become stronger and more frequent with time.  Contractions that start on the top of the uterus and spread down to the lower abdomen and back.  Increased pelvic pressure and dull back pain.  A watery or bloody mucus discharge that comes from the vagina.  Leaking of amniotic fluid. This is also known as your "water breaking." It could be a slow trickle or a gush. Let your health care provider know if it has a color or strange odor. If you have any of these signs, call your health care provider right away, even if it is before your due date. Follow these instructions at home: Medicines  Follow your health care provider's instructions regarding medicine use. Specific medicines may be either safe or unsafe to take during pregnancy.  Take a prenatal vitamin that contains at least 600 micrograms (mcg) of folic acid.  If you develop constipation, try taking a stool softener if your health care provider approves. Eating and drinking  Eat a balanced diet that includes fresh fruits and vegetables, whole grains, good sources of protein  such as meat, eggs, or tofu, and low-fat dairy. Your health care provider will help you determine the amount of weight gain that is right for you.  Avoid raw meat and uncooked cheese. These carry germs that can cause birth defects in the baby.  If you have low calcium intake from food, talk to your health care provider about whether you should take a daily calcium supplement.  Eat four or five small meals rather than three large meals a day.  Limit foods that are high in fat and processed sugars, such as fried and sweet foods.  To prevent constipation: ? Drink enough fluid to keep your urine clear or pale yellow. ? Eat foods that are high in fiber, such as fresh fruits and vegetables, whole grains, and beans. Activity  Exercise only as directed by your health care provider. Most women can continue their usual exercise routine during pregnancy. Try to exercise for 30 minutes at least 5 days a week. Stop exercising if you experience uterine contractions.  Avoid heavy   lifting.  Do not exercise in extreme heat or humidity, or at high altitudes.  Wear low-heel, comfortable shoes.  Practice good posture.  You may continue to have sex unless your health care provider tells you otherwise. Relieving pain and discomfort  Take frequent breaks and rest with your legs elevated if you have leg cramps or low back pain.  Take warm sitz baths to soothe any pain or discomfort caused by hemorrhoids. Use hemorrhoid cream if your health care provider approves.  Wear a good support bra to prevent discomfort from breast tenderness.  If you develop varicose veins: ? Wear support pantyhose or compression stockings as told by your healthcare provider. ? Elevate your feet for 15 minutes, 3-4 times a day. Prenatal care  Write down your questions. Take them to your prenatal visits.  Keep all your prenatal visits as told by your health care provider. This is important. Safety  Wear your seat belt at  all times when driving.  Make a list of emergency phone numbers, including numbers for family, friends, the hospital, and police and fire departments. General instructions  Avoid cat litter boxes and soil used by cats. These carry germs that can cause birth defects in the baby. If you have a cat, ask someone to clean the litter box for you.  Do not travel far distances unless it is absolutely necessary and only with the approval of your health care provider.  Do not use hot tubs, steam rooms, or saunas.  Do not drink alcohol.  Do not use any products that contain nicotine or tobacco, such as cigarettes and e-cigarettes. If you need help quitting, ask your health care provider.  Do not use any medicinal herbs or unprescribed drugs. These chemicals affect the formation and growth of the baby.  Do not douche or use tampons or scented sanitary pads.  Do not cross your legs for long periods of time.  To prepare for the arrival of your baby: ? Take prenatal classes to understand, practice, and ask questions about labor and delivery. ? Make a trial run to the hospital. ? Visit the hospital and tour the maternity area. ? Arrange for maternity or paternity leave through employers. ? Arrange for family and friends to take care of pets while you are in the hospital. ? Purchase a rear-facing car seat and make sure you know how to install it in your car. ? Pack your hospital bag. ? Prepare the baby's nursery. Make sure to remove all pillows and stuffed animals from the baby's crib to prevent suffocation.  Visit your dentist if you have not gone during your pregnancy. Use a soft toothbrush to brush your teeth and be gentle when you floss. Contact a health care provider if:  You are unsure if you are in labor or if your water has broken.  You become dizzy.  You have mild pelvic cramps, pelvic pressure, or nagging pain in your abdominal area.  You have lower back pain.  You have persistent  nausea, vomiting, or diarrhea.  You have an unusual or bad smelling vaginal discharge.  You have pain when you urinate. Get help right away if:  Your water breaks before 37 weeks.  You have regular contractions less than 5 minutes apart before 37 weeks.  You have a fever.  You are leaking fluid from your vagina.  You have spotting or bleeding from your vagina.  You have severe abdominal pain or cramping.  You have rapid weight loss or weight gain.    You have shortness of breath with chest pain.  You notice sudden or extreme swelling of your face, hands, ankles, feet, or legs.  Your baby makes fewer than 10 movements in 2 hours.  You have severe headaches that do not go away when you take medicine.  You have vision changes. Summary  The third trimester is from week 28 through week 40, months 7 through 9. The third trimester is a time when the unborn baby (fetus) is growing rapidly.  During the third trimester, your discomfort may increase as you and your baby continue to gain weight. You may have abdominal, leg, and back pain, sleeping problems, and an increased need to urinate.  During the third trimester your breasts will keep growing and they will continue to become tender. A yellow fluid (colostrum) may leak from your breasts. This is the first milk you are producing for your baby.  False labor is a condition in which you feel small, irregular tightenings of the muscles in the womb (contractions) that eventually go away. These are called Braxton Hicks contractions. Contractions may last for hours, days, or even weeks before true labor sets in.  Signs of labor can include: abdominal cramps; regular contractions that start at 10 minutes apart and become stronger and more frequent with time; watery or bloody mucus discharge that comes from the vagina; increased pelvic pressure and dull back pain; and leaking of amniotic fluid. This information is not intended to replace advice  given to you by your health care provider. Make sure you discuss any questions you have with your health care provider. Document Released: 12/30/2000 Document Revised: 06/13/2015 Document Reviewed: 03/08/2012 Elsevier Interactive Patient Education  2017 [employer]  

## 2017-02-23 NOTE — Addendum Note (Signed)
Addended by: Hamilton CapriBURCH, Lachell Rochette J on: 02/23/2017 09:52 AM   Modules accepted: Orders

## 2017-02-24 LAB — HIV ANTIBODY (ROUTINE TESTING W REFLEX): HIV SCREEN 4TH GENERATION: NONREACTIVE

## 2017-02-24 LAB — RPR: RPR: NONREACTIVE

## 2017-02-24 LAB — GLUCOSE TOLERANCE, 2 HOURS W/ 1HR
GLUCOSE, 1 HOUR: 128 mg/dL (ref 65–179)
GLUCOSE, 2 HOUR: 113 mg/dL (ref 65–152)
GLUCOSE, FASTING: 70 mg/dL (ref 65–91)

## 2017-02-24 LAB — CBC
HEMATOCRIT: 31.9 % — AB (ref 34.0–46.6)
Hemoglobin: 10.5 g/dL — ABNORMAL LOW (ref 11.1–15.9)
MCH: 29.8 pg (ref 26.6–33.0)
MCHC: 32.9 g/dL (ref 31.5–35.7)
MCV: 91 fL (ref 79–97)
PLATELETS: 252 10*3/uL (ref 150–379)
RBC: 3.52 x10E6/uL — ABNORMAL LOW (ref 3.77–5.28)
RDW: 13.7 % (ref 12.3–15.4)
WBC: 8.6 10*3/uL (ref 3.4–10.8)

## 2017-03-15 ENCOUNTER — Ambulatory Visit (INDEPENDENT_AMBULATORY_CARE_PROVIDER_SITE_OTHER): Payer: Medicaid Other | Admitting: Obstetrics and Gynecology

## 2017-03-15 ENCOUNTER — Encounter: Payer: Self-pay | Admitting: Obstetrics and Gynecology

## 2017-03-15 DIAGNOSIS — Z3403 Encounter for supervision of normal first pregnancy, third trimester: Secondary | ICD-10-CM

## 2017-03-15 DIAGNOSIS — Z3400 Encounter for supervision of normal first pregnancy, unspecified trimester: Secondary | ICD-10-CM

## 2017-03-15 NOTE — Progress Notes (Signed)
Patient reports good fetal movement, denies pain. 

## 2017-03-15 NOTE — Progress Notes (Signed)
   PRENATAL VISIT NOTE  Subjective:  Judy Boyle is a 27 y.o. G1P0 at 6423w5d being seen today for ongoing prenatal care.  She is currently monitored for the following issues for this low-risk pregnancy and has Supervision of normal first pregnancy, antepartum and History of ELISA positive for HSV on their problem list.  Patient reports no complaints.  Contractions: Not present. Vag. Bleeding: None.  Movement: Present. Denies leaking of fluid.   The following portions of the patient's history were reviewed and updated as appropriate: allergies, current medications, past family history, past medical history, past social history, past surgical history and problem list. Problem list updated.  Objective:   Vitals:   03/15/17 0857  BP: 104/69  Pulse: 98  Weight: 136 lb 12.8 oz (62.1 kg)    Fetal Status: Fetal Heart Rate (bpm): 144 Fundal Height: 29 cm Movement: Present     General:  Alert, oriented and cooperative. Patient is in no acute distress.  Skin: Skin is warm and dry. No rash noted.   Cardiovascular: Normal heart rate noted  Respiratory: Normal respiratory effort, no problems with respiration noted  Abdomen: Soft, gravid, appropriate for gestational age.  Pain/Pressure: Absent     Pelvic: Cervical exam deferred        Extremities: Normal range of motion.  Edema: None  Mental Status:  Normal mood and affect. Normal behavior. Normal judgment and thought content.   Assessment and Plan:  Pregnancy: G1P0 at 2923w5d  1. Supervision of normal first pregnancy, antepartum Patient is doing well without complaints Agreed to receiving tdap next visit Third trimester labs results reviewed  Preterm labor symptoms and general obstetric precautions including but not limited to vaginal bleeding, contractions, leaking of fluid and fetal movement were reviewed in detail with the patient. Please refer to After Visit Summary for other counseling recommendations.  Return in about 2 weeks  (around 03/29/2017) for ROB.   Catalina AntiguaPeggy Elsie Sakuma, MD

## 2017-03-29 ENCOUNTER — Encounter: Payer: Self-pay | Admitting: Obstetrics and Gynecology

## 2017-03-29 ENCOUNTER — Ambulatory Visit (INDEPENDENT_AMBULATORY_CARE_PROVIDER_SITE_OTHER): Payer: Medicaid Other | Admitting: Obstetrics and Gynecology

## 2017-03-29 VITALS — BP 111/72 | HR 99 | Wt 137.5 lb

## 2017-03-29 DIAGNOSIS — Z23 Encounter for immunization: Secondary | ICD-10-CM

## 2017-03-29 DIAGNOSIS — Z3400 Encounter for supervision of normal first pregnancy, unspecified trimester: Secondary | ICD-10-CM

## 2017-03-29 DIAGNOSIS — O368130 Decreased fetal movements, third trimester, not applicable or unspecified: Secondary | ICD-10-CM

## 2017-03-29 NOTE — Progress Notes (Signed)
   PRENATAL VISIT NOTE  Subjective:  Judy Boyle is a 27 y.o. G1P0 at 148w5d being seen today for ongoing prenatal care.  She is currently monitored for the following issues for this low-risk pregnancy and has Supervision of normal first pregnancy, antepartum and History of ELISA positive for HSV on their problem list.  Patient reports decreased fetal movement.  Contractions: Irritability. Vag. Bleeding: None.  Movement: Present. Denies leaking of fluid.   The following portions of the patient's history were reviewed and updated as appropriate: allergies, current medications, past family history, past medical history, past social history, past surgical history and problem list. Problem list updated.  Objective:   Vitals:   03/29/17 1128  BP: 111/72  Pulse: 99  Weight: 137 lb 8 oz (62.4 kg)    Fetal Status: Fetal Heart Rate (bpm): 155 Fundal Height: 31 cm Movement: Present     General:  Alert, oriented and cooperative. Patient is in no acute distress.  Skin: Skin is warm and dry. No rash noted.   Cardiovascular: Normal heart rate noted  Respiratory: Normal respiratory effort, no problems with respiration noted  Abdomen: Soft, gravid, appropriate for gestational age.  Pain/Pressure: Present     Pelvic: Cervical exam deferred        Extremities: Normal range of motion.  Edema: None  Mental Status:  Normal mood and affect. Normal behavior. Normal judgment and thought content.   Assessment and Plan:  Pregnancy: G1P0 at 728w5d  1. Supervision of normal first pregnancy, antepartum Patient is doing well Tdap today - Tdap vaccine greater than or equal to 7yo IM  2. Decreased fetal movements in third trimester, single or unspecified fetus Kick count reviewed with the patient - Fetal nonstress test= reviewed and reactive with baseline 145, mod variability, +accels, no decels  Preterm labor symptoms and general obstetric precautions including but not limited to vaginal bleeding,  contractions, leaking of fluid and fetal movement were reviewed in detail with the patient. Please refer to After Visit Summary for other counseling recommendations.  No Follow-up on file.   Catalina AntiguaPeggy Romanita Fager, MD

## 2017-03-29 NOTE — Patient Instructions (Signed)

## 2017-04-02 ENCOUNTER — Other Ambulatory Visit: Payer: Self-pay

## 2017-04-02 ENCOUNTER — Inpatient Hospital Stay (HOSPITAL_COMMUNITY): Payer: Medicaid Other

## 2017-04-02 ENCOUNTER — Encounter (HOSPITAL_COMMUNITY): Payer: Self-pay

## 2017-04-02 ENCOUNTER — Inpatient Hospital Stay (HOSPITAL_COMMUNITY)
Admission: EM | Admit: 2017-04-02 | Discharge: 2017-04-02 | Disposition: A | Discharge status: Home / Self Care | Payer: Medicaid Other | Attending: Obstetrics & Gynecology | Admitting: Obstetrics & Gynecology

## 2017-04-02 DIAGNOSIS — Z3A32 32 weeks gestation of pregnancy: Secondary | ICD-10-CM | POA: Diagnosis not present

## 2017-04-02 DIAGNOSIS — O26893 Other specified pregnancy related conditions, third trimester: Secondary | ICD-10-CM | POA: Insufficient documentation

## 2017-04-02 DIAGNOSIS — O9A219 Injury, poisoning and certain other consequences of external causes complicating pregnancy, unspecified trimester: Secondary | ICD-10-CM

## 2017-04-02 DIAGNOSIS — R109 Unspecified abdominal pain: Secondary | ICD-10-CM | POA: Insufficient documentation

## 2017-04-02 LAB — CBC
HEMATOCRIT: 27.2 % — AB (ref 36.0–46.0)
HEMOGLOBIN: 9 g/dL — AB (ref 12.0–15.0)
MCH: 28.3 pg (ref 26.0–34.0)
MCHC: 33.1 g/dL (ref 30.0–36.0)
MCV: 85.5 fL (ref 78.0–100.0)
Platelets: 217 10*3/uL (ref 150–400)
RBC: 3.18 MIL/uL — AB (ref 3.87–5.11)
RDW: 12.3 % (ref 11.5–15.5)
WBC: 8.1 10*3/uL (ref 4.0–10.5)

## 2017-04-02 LAB — BASIC METABOLIC PANEL
ANION GAP: 10 (ref 5–15)
BUN: 6 mg/dL (ref 6–20)
CHLORIDE: 103 mmol/L (ref 101–111)
CO2: 20 mmol/L — AB (ref 22–32)
Calcium: 8.5 mg/dL — ABNORMAL LOW (ref 8.9–10.3)
Creatinine, Ser: 0.47 mg/dL (ref 0.44–1.00)
GFR calc non Af Amer: >60 mL/min (ref >60)
GLUCOSE: 92 mg/dL (ref 65–99)
POTASSIUM: 3.2 mmol/L — AB (ref 3.5–5.1)
Sodium: 133 mmol/L — ABNORMAL LOW (ref 135–145)

## 2017-04-02 MED ORDER — SODIUM CHLORIDE 0.9 % IV BOLUS (SEPSIS)
1000.0000 mL | Freq: Once | INTRAVENOUS | Status: AC
  Administered 2017-04-02: 1000 mL via INTRAVENOUS

## 2017-04-02 NOTE — ED Notes (Signed)
[facility]

## 2017-04-02 NOTE — Discharge Instructions (Signed)
Braxton Hicks Contractions °Contractions of the uterus can occur throughout pregnancy, but they are not always a sign that you are in labor. You may have practice contractions called Braxton Hicks contractions. These false labor contractions are sometimes confused with true labor. °What are Braxton Hicks contractions? °Braxton Hicks contractions are tightening movements that occur in the muscles of the uterus before labor. Unlike true labor contractions, these contractions do not result in opening (dilation) and thinning of the cervix. Toward the end of pregnancy (32-34 weeks), Braxton Hicks contractions can happen more often and may become stronger. These contractions are sometimes difficult to tell apart from true labor because they can be very uncomfortable. You should not feel embarrassed if you go to the hospital with false labor. °Sometimes, the only way to tell if you are in true labor is for your health care provider to look for changes in the cervix. The health care provider will do a physical exam and may monitor your contractions. If you are not in true labor, the exam should show that your cervix is not dilating and your water has not broken. °If there are other health problems associated with your pregnancy, it is completely safe for you to be sent home with false labor. You may continue to have Braxton Hicks contractions until you go into true labor. °How to tell the difference between true labor and false labor °True labor °· Contractions last 30-70 seconds. °· Contractions become very regular. °· Discomfort is usually felt in the top of the uterus, and it spreads to the lower abdomen and low back. °· Contractions do not go away with walking. °· Contractions usually become more intense and increase in frequency. °· The cervix dilates and gets thinner. °False labor °· Contractions are usually shorter and not as strong as true labor contractions. °· Contractions are usually irregular. °· Contractions  are often felt in the front of the lower abdomen and in the groin. °· Contractions may go away when you walk around or change positions while lying down. °· Contractions get weaker and are shorter-lasting as time goes on. °· The cervix usually does not dilate or become thin. °Follow these instructions at home: °· Take over-the-counter and prescription medicines only as told by your health care provider. °· Keep up with your usual exercises and follow other instructions from your health care provider. °· Eat and drink lightly if you think you are going into labor. °· If Braxton Hicks contractions are making you uncomfortable: °? Change your position from lying down or resting to walking, or change from walking to resting. °? Sit and rest in a tub of warm water. °? Drink enough fluid to keep your urine pale yellow. Dehydration may cause these contractions. °? Do slow and deep breathing several times an hour. °· Keep all follow-up prenatal visits as told by your health care provider. This is important. °Contact a health care provider if: °· You have a fever. °· You have continuous pain in your abdomen. °Get help right away if: °· Your contractions become stronger, more regular, and closer together. °· You have fluid leaking or gushing from your vagina. °· You pass blood-tinged mucus (bloody show). °· You have bleeding from your vagina. °· You have low back pain that you never had before. °· You feel your baby’s head pushing down and causing pelvic pressure. °· Your baby is not moving inside you as much as it used to. °Summary °· Contractions that occur before labor are called Braxton   Hicks contractions, false labor, or practice contractions. °· Braxton Hicks contractions are usually shorter, weaker, farther apart, and less regular than true labor contractions. True labor contractions usually become progressively stronger and regular and they become more frequent. °· Manage discomfort from Braxton Hicks contractions by  changing position, resting in a warm bath, drinking plenty of water, or practicing deep breathing. °This information is not intended to replace advice given to you by your health care provider. Make sure you discuss any questions you have with your health care provider. °Document Released: 05/21/2016 Document Revised: 05/21/2016 Document Reviewed: 05/21/2016 °Elsevier Interactive Patient Education © 2018 [employer] ° °

## 2017-04-02 NOTE — ED Notes (Signed)
Pt to transfer to [facility] per OB RN. Accepting physician, Anyanwu
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
## 2017-04-12 ENCOUNTER — Encounter: Payer: Self-pay | Admitting: Obstetrics and Gynecology

## 2017-04-12 ENCOUNTER — Ambulatory Visit (INDEPENDENT_AMBULATORY_CARE_PROVIDER_SITE_OTHER): Payer: Medicaid Other | Admitting: Obstetrics and Gynecology

## 2017-04-12 DIAGNOSIS — Z3400 Encounter for supervision of normal first pregnancy, unspecified trimester: Secondary | ICD-10-CM

## 2017-04-12 NOTE — Progress Notes (Signed)
   PRENATAL VISIT NOTE  Subjective:  Judy Boyle is a 27 y.o. G1P0 at 242w5d being seen today for ongoing prenatal care.  She is currently monitored for the following issues for this low-risk pregnancy and has Supervision of normal first pregnancy, antepartum and History of ELISA positive for HSV on their problem list.  Patient reports no complaints.  Contractions: Irregular. Vag. Bleeding: None.  Movement: Present. Denies leaking of fluid.   The following portions of the patient's history were reviewed and updated as appropriate: allergies, current medications, past family history, past medical history, past social history, past surgical history and problem list. Problem list updated.  Objective:   Vitals:   04/12/17 1124  BP: 119/70  Pulse: (!) 110  Weight: 136 lb 8 oz (61.9 kg)    Fetal Status: Fetal Heart Rate (bpm): 146 Fundal Height: 32 cm Movement: Present     General:  Alert, oriented and cooperative. Patient is in no acute distress.  Skin: Skin is warm and dry. No rash noted.   Cardiovascular: Normal heart rate noted  Respiratory: Normal respiratory effort, no problems with respiration noted  Abdomen: Soft, gravid, appropriate for gestational age.  Pain/Pressure: Present     Pelvic: Cervical exam deferred        Extremities: Normal range of motion.  Edema: None  Mental Status:  Normal mood and affect. Normal behavior. Normal judgment and thought content.   Assessment and Plan:  Pregnancy: G1P0 at 292w5d  1. Supervision of normal first pregnancy, antepartum Patient is doing well without complaints Cultures next visit  Preterm labor symptoms and general obstetric precautions including but not limited to vaginal bleeding, contractions, leaking of fluid and fetal movement were reviewed in detail with the patient. Please refer to After Visit Summary for other counseling recommendations.  Return in about 2 weeks (around 04/26/2017) for ROB.   Catalina AntiguaPeggy Rorie Delmore, MD

## 2017-04-13 ENCOUNTER — Inpatient Hospital Stay (HOSPITAL_COMMUNITY)
Admission: AD | Admit: 2017-04-13 | Discharge: 2017-04-13 | Disposition: A | Discharge status: Home / Self Care | Payer: Medicaid Other | Source: Ambulatory Visit | Attending: Family Medicine | Admitting: Family Medicine

## 2017-04-13 ENCOUNTER — Encounter (HOSPITAL_COMMUNITY): Payer: Self-pay

## 2017-04-13 DIAGNOSIS — O99613 Diseases of the digestive system complicating pregnancy, third trimester: Secondary | ICD-10-CM | POA: Diagnosis not present

## 2017-04-13 DIAGNOSIS — A084 Viral intestinal infection, unspecified: Secondary | ICD-10-CM | POA: Insufficient documentation

## 2017-04-13 DIAGNOSIS — O9989 Other specified diseases and conditions complicating pregnancy, childbirth and the puerperium: Secondary | ICD-10-CM

## 2017-04-13 DIAGNOSIS — O212 Late vomiting of pregnancy: Secondary | ICD-10-CM | POA: Diagnosis present

## 2017-04-13 DIAGNOSIS — Z3A33 33 weeks gestation of pregnancy: Secondary | ICD-10-CM | POA: Diagnosis not present

## 2017-04-13 LAB — URINALYSIS, ROUTINE W REFLEX MICROSCOPIC
BILIRUBIN URINE: NEGATIVE
GLUCOSE, UA: NEGATIVE mg/dL
Hgb urine dipstick: NEGATIVE
KETONES UR: 80 mg/dL — AB
LEUKOCYTES UA: NEGATIVE
Nitrite: NEGATIVE
PH: 6 (ref 5.0–8.0)
Protein, ur: 30 mg/dL — AB
Specific Gravity, Urine: 1.021 (ref 1.005–1.030)

## 2017-04-13 LAB — COMPREHENSIVE METABOLIC PANEL
ALT: 24 U/L (ref 14–54)
ANION GAP: 11 (ref 5–15)
AST: 33 U/L (ref 15–41)
Albumin: 2.9 g/dL — ABNORMAL LOW (ref 3.5–5.0)
Alkaline Phosphatase: 160 U/L — ABNORMAL HIGH (ref 38–126)
BUN: 6 mg/dL (ref 6–20)
CALCIUM: 8.1 mg/dL — AB (ref 8.9–10.3)
CHLORIDE: 101 mmol/L (ref 101–111)
CO2: 19 mmol/L — ABNORMAL LOW (ref 22–32)
Creatinine, Ser: 0.49 mg/dL (ref 0.44–1.00)
GFR calc non Af Amer: >60 mL/min (ref >60)
Glucose, Bld: 82 mg/dL (ref 65–99)
POTASSIUM: 3.3 mmol/L — AB (ref 3.5–5.1)
SODIUM: 131 mmol/L — AB (ref 135–145)
Total Bilirubin: 1.5 mg/dL — ABNORMAL HIGH (ref 0.3–1.2)
Total Protein: 6.4 g/dL — ABNORMAL LOW (ref 6.5–8.1)

## 2017-04-13 LAB — CBC
HCT: 27.3 % — ABNORMAL LOW (ref 36.0–46.0)
Hemoglobin: 9.1 g/dL — ABNORMAL LOW (ref 12.0–15.0)
MCH: 27.9 pg (ref 26.0–34.0)
MCHC: 33.3 g/dL (ref 30.0–36.0)
MCV: 83.7 fL (ref 78.0–100.0)
PLATELETS: 221 10*3/uL (ref 150–400)
RBC: 3.26 MIL/uL — ABNORMAL LOW (ref 3.87–5.11)
RDW: 12.8 % (ref 11.5–15.5)
WBC: 6.6 10*3/uL (ref 4.0–10.5)

## 2017-04-13 MED ORDER — DEXTROSE 5 % IN LACTATED RINGERS IV BOLUS
1000.0000 mL | Freq: Once | INTRAVENOUS | Status: AC
  Administered 2017-04-13: 1000 mL via INTRAVENOUS

## 2017-04-13 MED ORDER — ONDANSETRON 8 MG PO TBDP: 8.0000 mg | ORAL_TABLET | Freq: Three times a day (TID) | ORAL | 1 refills | Status: DC | PRN

## 2017-04-13 MED ORDER — PROMETHAZINE HCL 25 MG PO TABS: 25.0000 mg | ORAL_TABLET | Freq: Four times a day (QID) | ORAL | 0 refills | Status: DC | PRN

## 2017-04-13 MED ORDER — M.V.I. ADULT IV INJ
Freq: Once | INTRAVENOUS | Status: AC
  Administered 2017-04-13: 21:00:00 via INTRAVENOUS
  Filled 2017-04-13: qty 10

## 2017-04-13 MED ORDER — ONDANSETRON HCL 4 MG/2ML IJ SOLN
4.0000 mg | Freq: Once | INTRAMUSCULAR | Status: AC
  Administered 2017-04-13: 4 mg via INTRAVENOUS
  Filled 2017-04-13: qty 2

## 2017-04-13 NOTE — Discharge Instructions (Signed)

## 2017-04-13 NOTE — MAU Note (Signed)
Pt started vomiting last night @ 2200, vomited several times,  last vomited @ 0800 this morning.  Diarrhea started during the night, approximately 10 episodes.  Denies fever.  Has body aches, had some abd tightening earlier today - none now.  No bleeding or LOF.

## 2017-04-13 NOTE — MAU Provider Note (Addendum)
History    CSN: [id number]  Arrival date and time: 04/13/17 1700   First Provider Initiated Contact with Patient 04/13/17 2031     Chief Complaint  Patient presents with  . Diarrhea  . Emesis   HPI Judy Boyle is a 27 y.o. G1P0 at 3258w6d who presents with nausea, vomiting and diarrhea that started last night at 10pm. Patient states she feels like it is a stomach virus but denies being around anyone with similar symptoms. She denies any leaking or bleeding. Reports good fetal movement.    OB History    Gravida  1   Para  0   Term      Preterm      AB      Living        SAB      TAB      Ectopic      Multiple      Live Births              Past Medical History:  Diagnosis Date  . Gonorrhea 2/14  . PID (acute pelvic inflammatory disease)     Past Surgical History:  Procedure Laterality Date  . WISDOM TOOTH EXTRACTION  6/14    Family History  Problem Relation Age of Onset  . Diabetes Maternal Uncle   . Breast cancer Maternal Grandmother     Social History   Tobacco Use  . Smoking status: Never Smoker  . Smokeless tobacco: Never Used  Substance Use Topics  . Alcohol use: Not Currently    Alcohol/week: 1.0 oz    Types: 2 Standard drinks or equivalent per week  . Drug use: No    Allergies: No Known Allergies  Medications Prior to Admission  Medication Sig Dispense Refill Last Dose  . Prenatal MV-Min-FA-Omega-3 (PRENATAL GUMMIES/DHA & FA PO) Take 1 tablet by mouth daily.    Taking    Review of Systems  Constitutional: Negative.  Negative for fatigue and fever.  HENT: Negative.   Respiratory: Negative.  Negative for shortness of breath.   Cardiovascular: Negative.  Negative for chest pain.  Gastrointestinal: Positive for diarrhea, nausea and vomiting. Negative for abdominal pain and constipation.  Genitourinary: Negative.  Negative for dysuria, vaginal bleeding and vaginal discharge.  Neurological: Negative.  Negative for dizziness  and headaches.   Physical Exam   Blood pressure 120/70, pulse (!) 107, temperature 98.8 F (37.1 C), temperature source Oral, resp. rate 16, height 5\' 6"  (1.676 m), weight 132 lb (59.9 kg), last menstrual period 08/19/2016.  Physical Exam  Nursing note and vitals reviewed. Constitutional: She is oriented to person, place, and time. She appears well-developed and well-nourished. No distress.  HENT:  Head: Normocephalic.  Eyes: Pupils are equal, round, and reactive to light.  Cardiovascular: Normal rate, regular rhythm and normal heart sounds.  Respiratory: Effort normal and breath sounds normal. No respiratory distress.  GI: Soft. Bowel sounds are normal. She exhibits no distension. There is no tenderness.  Neurological: She is alert and oriented to person, place, and time.  Skin: Skin is warm and dry.  Psychiatric: She has a normal mood and affect. Her behavior is normal. Judgment and thought content normal.   Fetal Tracing:  Baseline: 140 Variability: mdoerate Accels: 15x15 Decels: none  Toco: none  MAU Course  Procedures Results for orders placed or performed during the hospital encounter of 04/13/17 (from the past 24 hour(s))  Urinalysis, Routine w reflex microscopic     Status: Abnormal  Collection Time: 04/13/17  5:45 PM  Result Value Ref Range   Color, Urine YELLOW YELLOW   APPearance CLEAR CLEAR   Specific Gravity, Urine 1.021 1.005 - 1.030   pH 6.0 5.0 - 8.0   Glucose, UA NEGATIVE NEGATIVE mg/dL   Hgb urine dipstick NEGATIVE NEGATIVE   Bilirubin Urine NEGATIVE NEGATIVE   Ketones, ur 80 (A) NEGATIVE mg/dL   Protein, ur 30 (A) NEGATIVE mg/dL   Nitrite NEGATIVE NEGATIVE   Leukocytes, UA NEGATIVE NEGATIVE   RBC / HPF 0-5 0 - 5 RBC/hpf   WBC, UA 0-5 0 - 5 WBC/hpf   Bacteria, UA RARE (A) NONE SEEN   Squamous Epithelial / LPF 0-5 (A) NONE SEEN   Mucus PRESENT   CBC     Status: Abnormal   Collection Time: 04/13/17  7:37 PM  Result Value Ref Range   WBC 6.6 4.0 -  10.5 K/uL   RBC 3.26 (L) 3.87 - 5.11 MIL/uL   Hemoglobin 9.1 (L) 12.0 - 15.0 g/dL   HCT 53.6 (L) 64.4 - 03.4 %   MCV 83.7 78.0 - 100.0 fL   MCH 27.9 26.0 - 34.0 pg   MCHC 33.3 30.0 - 36.0 g/dL   RDW 74.2 59.5 - 63.8 %   Platelets 221 150 - 400 K/uL  Comprehensive metabolic panel     Status: Abnormal   Collection Time: 04/13/17  7:37 PM  Result Value Ref Range   Sodium 131 (L) 135 - 145 mmol/L   Potassium 3.3 (L) 3.5 - 5.1 mmol/L   Chloride 101 101 - 111 mmol/L   CO2 19 (L) 22 - 32 mmol/L   Glucose, Bld 82 65 - 99 mg/dL   BUN 6 6 - 20 mg/dL   Creatinine, Ser 7.56 0.44 - 1.00 mg/dL   Calcium 8.1 (L) 8.9 - 10.3 mg/dL   Total Protein 6.4 (L) 6.5 - 8.1 g/dL   Albumin 2.9 (L) 3.5 - 5.0 g/dL   AST 33 15 - 41 U/L   ALT 24 14 - 54 U/L   Alkaline Phosphatase 160 (H) 38 - 126 U/L   Total Bilirubin 1.5 (H) 0.3 - 1.2 mg/dL   GFR calc non Af Amer >60 >60 mL/min   GFR calc Af Amer >60 >60 mL/min   Anion gap 11 5 - 15   MDM UA CBC, CMP D5LR bolus Zofran IVP Multivitamin bolus  Patient reports relief from symptoms. Able to tolerate PO fluids.  Assessment and Plan   1. Viral gastroenteritis   2. [redacted] weeks gestation of pregnancy    -Discharge home in stable condition -Rx for zofran ODT and phenergan given to patient -BRAT diet and viral gastroenteritis precautions discussed -Patient advised to follow-up with [facility] as scheduled for prenatal care -Patient may return to MAU as needed or if her condition were to change or worsen  Rolm Bookbinder CNM 04/13/2017, 8:31 PM

## 2017-04-26 ENCOUNTER — Encounter: Payer: Self-pay | Admitting: Obstetrics and Gynecology

## 2017-04-26 ENCOUNTER — Other Ambulatory Visit (HOSPITAL_COMMUNITY)
Admission: RE | Admit: 2017-04-26 | Discharge: 2017-04-26 | Disposition: A | Discharge status: Home / Self Care | Payer: Medicaid Other | Source: Ambulatory Visit | Attending: Obstetrics and Gynecology | Admitting: Obstetrics and Gynecology

## 2017-04-26 ENCOUNTER — Ambulatory Visit (INDEPENDENT_AMBULATORY_CARE_PROVIDER_SITE_OTHER): Payer: Medicaid Other | Admitting: Obstetrics and Gynecology

## 2017-04-26 VITALS — BP 110/71 | HR 93 | Temp 97.4°F | Wt 134.3 lb

## 2017-04-26 DIAGNOSIS — Z3400 Encounter for supervision of normal first pregnancy, unspecified trimester: Secondary | ICD-10-CM | POA: Diagnosis not present

## 2017-04-26 DIAGNOSIS — Z8619 Personal history of other infectious and parasitic diseases: Secondary | ICD-10-CM

## 2017-04-26 MED ORDER — VALACYCLOVIR HCL 500 MG PO TABS: 500.0000 mg | ORAL_TABLET | Freq: Two times a day (BID) | ORAL | 6 refills | Status: DC

## 2017-04-26 NOTE — Progress Notes (Signed)
   PRENATAL VISIT NOTE  Subjective:  Judy Boyle is a 27 y.o. G1P0 at 77101w5d being seen today for ongoing prenatal care.  She is currently monitored for the following issues for this low-risk pregnancy and has Supervision of normal first pregnancy, antepartum and History of ELISA positive for HSV on their problem list.  Patient reports no complaints.  Contractions: Irregular. Vag. Bleeding: None.  Movement: Present. Denies leaking of fluid.   The following portions of the patient's history were reviewed and updated as appropriate: allergies, current medications, past family history, past medical history, past social history, past surgical history and problem list. Problem list updated.  Objective:   Vitals:   04/26/17 1124  BP: 110/71  Pulse: 93  Temp: (!) 97.4 F (36.3 C)  Weight: 134 lb 4.8 oz (60.9 kg)    Fetal Status: Fetal Heart Rate (bpm): 156 Fundal Height: 34 cm Movement: Present  Presentation: Vertex  General:  Alert, oriented and cooperative. Patient is in no acute distress.  Skin: Skin is warm and dry. No rash noted.   Cardiovascular: Normal heart rate noted  Respiratory: Normal respiratory effort, no problems with respiration noted  Abdomen: Soft, gravid, appropriate for gestational age.  Pain/Pressure: Present     Pelvic: Cervical exam performed Dilation: Closed Effacement (%): Thick Station: Ballotable  Extremities: Normal range of motion.  Edema: None  Mental Status: Normal mood and affect. Normal behavior. Normal judgment and thought content.   Assessment and Plan:  Pregnancy: G1P0 at 41101w5d  1. Supervision of normal first pregnancy, antepartum Patient is doing well without complaints Cultures collected - Culture, beta strep (group b only) - GC[facility]Chlamydia probe amp [facility]  2. History of ELISA positive for HSV Rx valtrex provided  Preterm labor symptoms and general obstetric precautions including but not limited to vaginal bleeding,  contractions, leaking of fluid and fetal movement were reviewed in detail with the patient. Please refer to After Visit Summary for other counseling recommendations.  Return in about 1 week (around 05/03/2017) for ROB.  No future appointments.  Catalina AntiguaPeggy Quanisha Drewry, MD

## 2017-04-27 LAB — GC/CHLAMYDIA PROBE AMP (~~LOC~~) NOT AT ARMC
Chlamydia: NEGATIVE
Neisseria Gonorrhea: NEGATIVE

## 2017-04-30 LAB — CULTURE, BETA STREP (GROUP B ONLY): STREP GP B CULTURE: NEGATIVE

## 2017-05-03 ENCOUNTER — Encounter: Payer: Self-pay | Admitting: Obstetrics and Gynecology

## 2017-05-03 ENCOUNTER — Ambulatory Visit (INDEPENDENT_AMBULATORY_CARE_PROVIDER_SITE_OTHER): Payer: Medicaid Other | Admitting: Obstetrics and Gynecology

## 2017-05-03 VITALS — BP 115/81 | HR 90 | Wt 139.2 lb

## 2017-05-03 DIAGNOSIS — Z3400 Encounter for supervision of normal first pregnancy, unspecified trimester: Secondary | ICD-10-CM

## 2017-05-03 DIAGNOSIS — Z8619 Personal history of other infectious and parasitic diseases: Secondary | ICD-10-CM

## 2017-05-03 DIAGNOSIS — Z3403 Encounter for supervision of normal first pregnancy, third trimester: Secondary | ICD-10-CM

## 2017-05-03 NOTE — Progress Notes (Signed)
14

## 2017-05-03 NOTE — Progress Notes (Signed)
   PRENATAL VISIT NOTE  Subjective:  Judy Boyle is a 27 y.o. G1P0 at 5320w5d being seen today for ongoing prenatal care.  She is currently monitored for the following issues for this low-risk pregnancy and has Supervision of normal first pregnancy, antepartum and History of ELISA positive for HSV on their problem list.  Patient reports no complaints.  Contractions: Irregular. Vag. Bleeding: None.  Movement: Present. Denies leaking of fluid.   The following portions of the patient's history were reviewed and updated as appropriate: allergies, current medications, past family history, past medical history, past social history, past surgical history and problem list. Problem list updated.  Objective:   Vitals:   05/03/17 1526  BP: 115/81  Pulse: 90  Weight: 139 lb 3.2 oz (63.1 kg)    Fetal Status: Fetal Heart Rate (bpm): 145 Fundal Height: 36 cm Movement: Present     General:  Alert, oriented and cooperative. Patient is in no acute distress.  Skin: Skin is warm and dry. No rash noted.   Cardiovascular: Normal heart rate noted  Respiratory: Normal respiratory effort, no problems with respiration noted  Abdomen: Soft, gravid, appropriate for gestational age.  Pain/Pressure: Present     Pelvic: Cervical exam deferred        Extremities: Normal range of motion.  Edema: None  Mental Status: Normal mood and affect. Normal behavior. Normal judgment and thought content.   Assessment and Plan:  Pregnancy: G1P0 at 8720w5d  1. Supervision of normal first pregnancy, antepartum Patient is doing well Reviewed GBS results Reviewed labor precautions  2. History of ELISA positive for HSV Continue Valtrex  Preterm labor symptoms and general obstetric precautions including but not limited to vaginal bleeding, contractions, leaking of fluid and fetal movement were reviewed in detail with the patient. Please refer to After Visit Summary for other counseling recommendations.  Return in about 1  week (around 05/10/2017) for ROB.  No future appointments.  Catalina AntiguaPeggy Kippy Gohman, MD

## 2017-05-10 ENCOUNTER — Encounter: Payer: Self-pay | Admitting: Obstetrics and Gynecology

## 2017-05-10 ENCOUNTER — Ambulatory Visit (INDEPENDENT_AMBULATORY_CARE_PROVIDER_SITE_OTHER): Payer: Medicaid Other | Admitting: Obstetrics and Gynecology

## 2017-05-10 DIAGNOSIS — Z3400 Encounter for supervision of normal first pregnancy, unspecified trimester: Secondary | ICD-10-CM

## 2017-05-10 NOTE — Progress Notes (Signed)
   PRENATAL VISIT NOTE  Subjective:  Judy Boyle is a 27 y.o. G1P0 at 5131w5d being seen today for ongoing prenatal care.  She is currently monitored for the following issues for this low-risk pregnancy and has Supervision of normal first pregnancy, antepartum and History of ELISA positive for HSV on their problem list.  Patient reports no complaints.  Contractions: Irregular. Vag. Bleeding: None.  Movement: Present. Denies leaking of fluid.   The following portions of the patient's history were reviewed and updated as appropriate: allergies, current medications, past family history, past medical history, past social history, past surgical history and problem list. Problem list updated.  Objective:   Vitals:   05/10/17 1312  BP: 110/71  Pulse: 94  Weight: 137 lb 1.6 oz (62.2 kg)    Fetal Status: Fetal Heart Rate (bpm): 145 Fundal Height: 37 cm Movement: Present     General:  Alert, oriented and cooperative. Patient is in no acute distress.  Skin: Skin is warm and dry. No rash noted.   Cardiovascular: Normal heart rate noted  Respiratory: Normal respiratory effort, no problems with respiration noted  Abdomen: Soft, gravid, appropriate for gestational age.  Pain/Pressure: Present     Pelvic: Cervical exam deferred        Extremities: Normal range of motion.  Edema: None  Mental Status: Normal mood and affect. Normal behavior. Normal judgment and thought content.   Assessment and Plan:  Pregnancy: G1P0 at 6031w5d  1. Supervision of normal first pregnancy, antepartum Patient is doing well without complaints Continue Valtrex Culture results reviewed with the patient  Term labor symptoms and general obstetric precautions including but not limited to vaginal bleeding, contractions, leaking of fluid and fetal movement were reviewed in detail with the patient. Please refer to After Visit Summary for other counseling recommendations.  Return in about 1 week (around 05/17/2017) for  ROB.  No future appointments.  Catalina AntiguaPeggy Judia Arnott, MD

## 2017-05-13 ENCOUNTER — Encounter (HOSPITAL_COMMUNITY): Payer: Self-pay | Admitting: *Deleted

## 2017-05-13 ENCOUNTER — Telehealth: Payer: Self-pay

## 2017-05-13 ENCOUNTER — Encounter (HOSPITAL_COMMUNITY): Payer: Self-pay

## 2017-05-13 ENCOUNTER — Inpatient Hospital Stay (HOSPITAL_COMMUNITY)
Admission: AD | Admit: 2017-05-13 | Discharge: 2017-05-15 | DRG: 806 | Disposition: A | Discharge status: Home / Self Care | Payer: Medicaid Other | Source: Ambulatory Visit | Attending: Obstetrics and Gynecology | Admitting: Obstetrics and Gynecology

## 2017-05-13 ENCOUNTER — Inpatient Hospital Stay (HOSPITAL_COMMUNITY): Payer: Medicaid Other | Admitting: Anesthesiology

## 2017-05-13 ENCOUNTER — Inpatient Hospital Stay (HOSPITAL_COMMUNITY)
Admission: AD | Admit: 2017-05-13 | Discharge: 2017-05-13 | Disposition: A | Discharge status: Home / Self Care | Payer: Medicaid Other | Source: Ambulatory Visit | Attending: Obstetrics & Gynecology | Admitting: Obstetrics & Gynecology

## 2017-05-13 DIAGNOSIS — N898 Other specified noninflammatory disorders of vagina: Secondary | ICD-10-CM

## 2017-05-13 DIAGNOSIS — O26893 Other specified pregnancy related conditions, third trimester: Principal | ICD-10-CM

## 2017-05-13 DIAGNOSIS — O4290 Premature rupture of membranes, unspecified as to length of time between rupture and onset of labor, unspecified weeks of gestation: Secondary | ICD-10-CM | POA: Diagnosis present

## 2017-05-13 DIAGNOSIS — O9832 Other infections with a predominantly sexual mode of transmission complicating childbirth: Secondary | ICD-10-CM | POA: Diagnosis present

## 2017-05-13 DIAGNOSIS — Z3A38 38 weeks gestation of pregnancy: Secondary | ICD-10-CM | POA: Diagnosis not present

## 2017-05-13 DIAGNOSIS — A6000 Herpesviral infection of urogenital system, unspecified: Secondary | ICD-10-CM | POA: Diagnosis present

## 2017-05-13 DIAGNOSIS — O4292 Full-term premature rupture of membranes, unspecified as to length of time between rupture and onset of labor: Secondary | ICD-10-CM | POA: Diagnosis present

## 2017-05-13 LAB — CBC
HCT: 30.5 % — ABNORMAL LOW (ref 36.0–46.0)
Hemoglobin: 10 g/dL — ABNORMAL LOW (ref 12.0–15.0)
MCH: 27.1 pg (ref 26.0–34.0)
MCHC: 32.8 g/dL (ref 30.0–36.0)
MCV: 82.7 fL (ref 78.0–100.0)
Platelets: 276 10*3/uL (ref 150–400)
RBC: 3.69 MIL/uL — ABNORMAL LOW (ref 3.87–5.11)
RDW: 14.4 % (ref 11.5–15.5)
WBC: 7.3 10*3/uL (ref 4.0–10.5)

## 2017-05-13 LAB — OB RESULTS CONSOLE GBS: GBS: NEGATIVE

## 2017-05-13 LAB — ABO/RH: ABO/RH(D): A POS

## 2017-05-13 LAB — TYPE AND SCREEN
ABO/RH(D): A POS
ANTIBODY SCREEN: NEGATIVE

## 2017-05-13 LAB — URINALYSIS, ROUTINE W REFLEX MICROSCOPIC
BILIRUBIN URINE: NEGATIVE
Glucose, UA: NEGATIVE mg/dL
Hgb urine dipstick: NEGATIVE
KETONES UR: 20 mg/dL — AB
Leukocytes, UA: NEGATIVE
NITRITE: NEGATIVE
PROTEIN: NEGATIVE mg/dL
SPECIFIC GRAVITY, URINE: 1.015 (ref 1.005–1.030)
pH: 7 (ref 5.0–8.0)

## 2017-05-13 LAB — POCT FERN TEST
POCT FERN TEST: NEGATIVE
POCT FERN TEST: NEGATIVE

## 2017-05-13 LAB — AMNISURE RUPTURE OF MEMBRANE (ROM) NOT AT ARMC: Amnisure ROM: POSITIVE

## 2017-05-13 MED ORDER — LACTATED RINGERS IV SOLN
500.0000 mL | INTRAVENOUS | Status: DC | PRN
Start: 2017-05-13 — End: 2017-05-14
  Administered 2017-05-13: 500 mL via INTRAVENOUS

## 2017-05-13 MED ORDER — OXYTOCIN 40 UNITS IN LACTATED RINGERS INFUSION - SIMPLE MED
1.0000 m[IU]/min | INTRAVENOUS | Status: DC
  Administered 2017-05-13: 2 m[IU]/min via INTRAVENOUS

## 2017-05-13 MED ORDER — PHENYLEPHRINE 40 MCG/ML (10ML) SYRINGE FOR IV PUSH (FOR BLOOD PRESSURE SUPPORT)
PREFILLED_SYRINGE | INTRAVENOUS | Status: AC
  Filled 2017-05-13: qty 20

## 2017-05-13 MED ORDER — OXYTOCIN 40 UNITS IN LACTATED RINGERS INFUSION - SIMPLE MED
2.5000 [IU]/h | INTRAVENOUS | Status: DC
  Administered 2017-05-14: 2.5 [IU]/h via INTRAVENOUS
  Filled 2017-05-13: qty 1000

## 2017-05-13 MED ORDER — FENTANYL 2.5 MCG/ML BUPIVACAINE 1/10 % EPIDURAL INFUSION (WH - ANES)
14.0000 mL/h | INTRAMUSCULAR | Status: DC | PRN
  Administered 2017-05-13: 14 mL/h via EPIDURAL

## 2017-05-13 MED ORDER — LIDOCAINE HCL (PF) 1 % IJ SOLN
INTRAMUSCULAR | Status: DC | PRN
  Administered 2017-05-13 (×2): 4 mL

## 2017-05-13 MED ORDER — FENTANYL 2.5 MCG/ML BUPIVACAINE 1/10 % EPIDURAL INFUSION (WH - ANES): 14.0000 mL/h | INTRAMUSCULAR | Status: DC | PRN

## 2017-05-13 MED ORDER — PHENYLEPHRINE 40 MCG/ML (10ML) SYRINGE FOR IV PUSH (FOR BLOOD PRESSURE SUPPORT): 80.0000 ug | PREFILLED_SYRINGE | INTRAVENOUS | Status: DC | PRN

## 2017-05-13 MED ORDER — FENTANYL 2.5 MCG/ML BUPIVACAINE 1/10 % EPIDURAL INFUSION (WH - ANES)
INTRAMUSCULAR | Status: AC
  Filled 2017-05-13: qty 100

## 2017-05-13 MED ORDER — OXYCODONE-ACETAMINOPHEN 5-325 MG PO TABS: 2.0000 | ORAL_TABLET | ORAL | Status: DC | PRN

## 2017-05-13 MED ORDER — OXYTOCIN BOLUS FROM INFUSION
500.0000 mL | Freq: Once | INTRAVENOUS | Status: AC
  Administered 2017-05-14: 500 mL via INTRAVENOUS

## 2017-05-13 MED ORDER — TERBUTALINE SULFATE 1 MG/ML IJ SOLN: 0.2500 mg | Freq: Once | INTRAMUSCULAR | Status: DC | PRN

## 2017-05-13 MED ORDER — LACTATED RINGERS IV SOLN
500.0000 mL | Freq: Once | INTRAVENOUS | Status: AC
  Administered 2017-05-13: 500 mL via INTRAVENOUS

## 2017-05-13 MED ORDER — FENTANYL CITRATE (PF) 100 MCG/2ML IJ SOLN: 100.0000 ug | INTRAMUSCULAR | Status: DC | PRN

## 2017-05-13 MED ORDER — SOD CITRATE-CITRIC ACID 500-334 MG/5ML PO SOLN: 30.0000 mL | ORAL | Status: DC | PRN

## 2017-05-13 MED ORDER — DIPHENHYDRAMINE HCL 50 MG/ML IJ SOLN: 12.5000 mg | INTRAMUSCULAR | Status: DC | PRN

## 2017-05-13 MED ORDER — OXYCODONE-ACETAMINOPHEN 5-325 MG PO TABS: 1.0000 | ORAL_TABLET | ORAL | Status: DC | PRN

## 2017-05-13 MED ORDER — LIDOCAINE HCL (PF) 1 % IJ SOLN
30.0000 mL | INTRAMUSCULAR | Status: DC | PRN
Start: 2017-05-13 — End: 2017-05-14
  Administered 2017-05-14: 30 mL via SUBCUTANEOUS
  Filled 2017-05-13: qty 30

## 2017-05-13 MED ORDER — EPHEDRINE 5 MG/ML INJ: 10.0000 mg | INTRAVENOUS | Status: DC | PRN

## 2017-05-13 MED ORDER — ACETAMINOPHEN 325 MG PO TABS: 650.0000 mg | ORAL_TABLET | ORAL | Status: DC | PRN

## 2017-05-13 MED ORDER — ONDANSETRON HCL 4 MG/2ML IJ SOLN: 4.0000 mg | Freq: Four times a day (QID) | INTRAMUSCULAR | Status: DC | PRN

## 2017-05-13 MED ORDER — MISOPROSTOL 50MCG HALF TABLET
50.0000 ug | ORAL_TABLET | ORAL | Status: DC
  Administered 2017-05-13: 50 ug via BUCCAL
  Filled 2017-05-13: qty 1

## 2017-05-13 MED ORDER — LACTATED RINGERS IV SOLN
INTRAVENOUS | Status: DC
  Administered 2017-05-13 (×2): via INTRAVENOUS

## 2017-05-13 NOTE — Progress Notes (Signed)
Patient Vitals for the past 4 hrs:  BP Temp Temp src Pulse Resp SpO2  05/13/17 1924 121/80 98.2 F (36.8 C) Oral 97 18 -  05/13/17 1920 - - - - - 100 %  05/13/17 1844 - - - - 16 -  05/13/17 1739 - - - - 16 -  05/13/17 1701 125/75 - - 94 16 -  05/13/17 1630 125/78 - - 92 - -  05/13/17 1627 - 98.3 F (36.8 C) Oral - 16 -   Pt wants epidural. Didn't like nitrous.  Discussed how early epidural can slow labor and she may need pitocin, perfectly fine with that. Cx 2/80/-1/posterior, ctx q 2-3 minutes. FHR Cat 1.

## 2017-05-13 NOTE — Discharge Instructions (Signed)
Vaginal Delivery Vaginal delivery means that you will give birth by pushing your baby out of your birth canal (vagina). A team of health care providers will help you before, during, and after vaginal delivery. Birth experiences are unique for every woman and every pregnancy, and birth experiences vary depending on where you choose to give birth. What should I do to prepare for my baby's birth? Before your baby is born, it is important to talk with your health care provider about:  Your labor and delivery preferences. These may include: ? Medicines that you may be given. ? How you will manage your pain. This might include non-medical pain relief techniques or injectable pain relief such as epidural analgesia. ? How you and your baby will be monitored during labor and delivery. ? Who may be in the labor and delivery room with you. ? Your feelings about surgical delivery of your baby (cesarean delivery, or C-section) if this becomes necessary. ? Your feelings about receiving donated blood through an IV tube (blood transfusion) if this becomes necessary.  Whether you are able: ? To take pictures or videos of the birth. ? To eat during labor and delivery. ? To move around, walk, or change positions during labor and delivery.  What to expect after your baby is born, such as: ? Whether delayed umbilical cord clamping and cutting is offered. ? Who will care for your baby right after birth. ? Medicines or tests that may be recommended for your baby. ? Whether breastfeeding is supported in your hospital or birth center. ? How long you will be in the hospital or birth center.  How any medical conditions you have may affect your baby or your labor and delivery experience.  To prepare for your baby's birth, you should also:  Attend all of your health care visits before delivery (prenatal visits) as recommended by your health care provider. This is important.  Prepare your home for your baby's  arrival. Make sure that you have: ? Diapers. ? Baby clothing. ? Feeding equipment. ? Safe sleeping arrangements for you and your baby.  Install a car seat in your vehicle. Have your car seat checked by a certified car seat installer to make sure that it is installed safely.  Think about who will help you with your new baby at home for at least the first several weeks after delivery.  What can I expect when I arrive at the birth center or hospital? Once you are in labor and have been admitted into the hospital or birth center, your health care provider may:  Review your pregnancy history and any concerns you have.  Insert an IV tube into one of your veins. This is used to give you fluids and medicines.  Check your blood pressure, pulse, temperature, and heart rate (vital signs).  Check whether your bag of water (amniotic sac) has broken (ruptured).  Talk with you about your birth plan and discuss pain control options.  Monitoring Your health care provider may monitor your contractions (uterine monitoring) and your baby's heart rate (fetal monitoring). You may need to be monitored:  Often, but not continuously (intermittently).  All the time or for long periods at a time (continuously). Continuous monitoring may be needed if: ? You are taking certain medicines, such as medicine to relieve pain or make your contractions stronger. ? You have pregnancy or labor complications.  Monitoring may be done by:  Placing a special stethoscope or a handheld monitoring device on your abdomen to   check your baby's heartbeat, and feeling your abdomen for contractions. This method of monitoring does not continuously record your baby's heartbeat or your contractions.  Placing monitors on your abdomen (external monitors) to record your baby's heartbeat and the frequency and length of contractions. You may not have to wear external monitors all the time.  Placing monitors inside of your uterus  (internal monitors) to record your baby's heartbeat and the frequency, length, and strength of your contractions. ? Your health care provider may use internal monitors if he or she needs more information about the strength of your contractions or your baby's heart rate. ? Internal monitors are put in place by passing a thin, flexible wire through your vagina and into your uterus. Depending on the type of monitor, it may remain in your uterus or on your baby's head until birth. ? Your health care provider will discuss the benefits and risks of internal monitoring with you and will ask for your permission before inserting the monitors.  Telemetry. This is a type of continuous monitoring that can be done with external or internal monitors. Instead of having to stay in bed, you are able to move around during telemetry. Ask your health care provider if telemetry is an option for you.  Physical exam Your health care provider may perform a physical exam. This may include:  Checking whether your baby is positioned: ? With the head toward your vagina (head-down). This is most common. ? With the head toward the top of your uterus (head-up or breech). If your baby is in a breech position, your health care provider may try to turn your baby to a head-down position so you can deliver vaginally. If it does not seem that your baby can be born vaginally, your provider may recommend surgery to deliver your baby. In rare cases, you may be able to deliver vaginally if your baby is head-up (breech delivery). ? Lying sideways (transverse). Babies that are lying sideways cannot be delivered vaginally.  Checking your cervix to determine: ? Whether it is thinning out (effacing). ? Whether it is opening up (dilating). ? How low your baby has moved into your birth canal.  What are the three stages of labor and delivery?  Normal labor and delivery is divided into the following three stages: Stage 1  Stage 1 is the  longest stage of labor, and it can last for hours or days. Stage 1 includes: ? Early labor. This is when contractions may be irregular, or regular and mild. Generally, early labor contractions are more than 10 minutes apart. ? Active labor. This is when contractions get longer, more regular, more frequent, and more intense. ? The transition phase. This is when contractions happen very close together, are very intense, and may last longer than during any other part of labor.  Contractions generally feel mild, infrequent, and irregular at first. They get stronger, more frequent (about every 2-3 minutes), and more regular as you progress from early labor through active labor and transition.  Many women progress through stage 1 naturally, but you may need help to continue making progress. If this happens, your health care provider may talk with you about: ? Rupturing your amniotic sac if it has not ruptured yet. ? Giving you medicine to help make your contractions stronger and more frequent.  Stage 1 ends when your cervix is completely dilated to 4 inches (10 cm) and completely effaced. This happens at the end of the transition phase. Stage 2  Once   your cervix is completely effaced and dilated to 4 inches (10 cm), you may start to feel an urge to push. It is common for the body to naturally take a rest before feeling the urge to push, especially if you received an epidural or certain other pain medicines. This rest period may last for up to 1-2 hours, depending on your unique labor experience.  During stage 2, contractions are generally less painful, because pushing helps relieve contraction pain. Instead of contraction pain, you may feel stretching and burning pain, especially when the widest part of your baby's head passes through the vaginal opening (crowning).  Your health care provider will closely monitor your pushing progress and your baby's progress through the vagina during stage 2.  Your  health care provider may massage the area of skin between your vaginal opening and anus (perineum) or apply warm compresses to your perineum. This helps it stretch as the baby's head starts to crown, which can help prevent perineal tearing. ? In some cases, an incision may be made in your perineum (episiotomy) to allow the baby to pass through the vaginal opening. An episiotomy helps to make the opening of the vagina larger to allow more room for the baby to fit through.  It is very important to breathe and focus so your health care provider can control the delivery of your baby's head. Your health care provider may have you decrease the intensity of your pushing, to help prevent perineal tearing.  After delivery of your baby's head, the shoulders and the rest of the body generally deliver very quickly and without difficulty.  Once your baby is delivered, the umbilical cord may be cut right away, or this may be delayed for 1-2 minutes, depending on your baby's health. This may vary among health care providers, hospitals, and birth centers.  If you and your baby are healthy enough, your baby may be placed on your chest or abdomen to help maintain the baby's temperature and to help you bond with each other. Some mothers and babies start breastfeeding at this time. Your health care team will dry your baby and help keep your baby warm during this time.  Your baby may need immediate care if he or she: ? Showed signs of distress during labor. ? Has a medical condition. ? Was born too early (prematurely). ? Had a bowel movement before birth (meconium). ? Shows signs of difficulty transitioning from being inside the uterus to being outside of the uterus. If you are planning to breastfeed, your health care team will help you begin a feeding. Stage 3  The third stage of labor starts immediately after the birth of your baby and ends after you deliver the placenta. The placenta is an organ that develops  during pregnancy to provide oxygen and nutrients to your baby in the womb.  Delivering the placenta may require some pushing, and you may have mild contractions. Breastfeeding can stimulate contractions to help you deliver the placenta.  After the placenta is delivered, your uterus should tighten (contract) and become firm. This helps to stop bleeding in your uterus. To help your uterus contract and to control bleeding, your health care provider may: ? Give you medicine by injection, through an IV tube, by mouth, or through your rectum (rectally). ? Massage your abdomen or perform a vaginal exam to remove any blood clots that are left in your uterus. ? Empty your bladder by placing a thin, flexible tube (catheter) into your bladder. ? Encourage   you to breastfeed your baby. After labor is over, you and your baby will be monitored closely to ensure that you are both healthy until you are ready to go home. Your health care team will teach you how to care for yourself and your baby. This information is not intended to replace advice given to you by your health care provider. Make sure you discuss any questions you have with your health care provider. Document Released: 10/15/2007 Document Revised: 07/26/2015 Document Reviewed: 01/20/2015 Elsevier Interactive Patient Education  2018 [employer]  

## 2017-05-13 NOTE — Anesthesia Preprocedure Evaluation (Signed)
Anesthesia Evaluation  Patient identified by MRN, date of birth, ID band Patient awake    Reviewed: Allergy & Precautions, NPO status , Patient's Chart, lab work & pertinent test results  Airway Mallampati: II  TM Distance: >3 FB Neck ROM: Full    Dental no notable dental hx.    Pulmonary neg pulmonary ROS,    Pulmonary exam normal breath sounds clear to auscultation       Cardiovascular negative cardio ROS Normal cardiovascular exam Rhythm:Regular Rate:Normal     Neuro/Psych negative neurological ROS  negative psych ROS   GI/Hepatic negative GI ROS, Neg liver ROS,   Endo/Other  negative endocrine ROS  Renal/GU negative Renal ROS     Musculoskeletal negative musculoskeletal ROS (+)   Abdominal   Peds  Hematology negative hematology ROS (+)   Anesthesia Other Findings   Reproductive/Obstetrics (+) Pregnancy                             Anesthesia Physical Anesthesia Plan  ASA: II  Anesthesia Plan: Epidural   Post-op Pain Management:    Induction:   PONV Risk Score and Plan:   Airway Management Planned:   Additional Equipment:   Intra-op Plan:   Post-operative Plan:   Informed Consent: I have reviewed the patients History and Physical, chart, labs and discussed the procedure including the risks, benefits and alternatives for the proposed anesthesia with the patient or authorized representative who has indicated his/her understanding and acceptance.   Dental advisory given  Plan Discussed with: CRNA  Anesthesia Plan Comments:         Anesthesia Quick Evaluation

## 2017-05-13 NOTE — MAU Provider Note (Signed)
Chief Complaint:  Vaginal Bleeding   Seen at 0630    HPI: Judy Boyle is a 27 y.o. G1P0 at 3138w1dwho presents to maternity admissions reporting wetness on underwear. No soaking of bedclothes.  Intermittent mild contractions. . She reports good fetal movement, denies vaginal bleeding, vaginal itching/burning, urinary symptoms, h/a, dizziness, n/v, diarrhea, constipation or fever/chills.  Vaginal Bleeding  The patient's primary symptoms include vaginal bleeding (states is show). The patient's pertinent negatives include no genital itching, genital lesions or genital odor. This is a new problem. The current episode started today. The problem occurs rarely. The problem has been resolved. The patient is experiencing no pain. She is pregnant. Pertinent negatives include no chills, constipation, diarrhea, fever, nausea or vomiting. The vaginal discharge was bloody, clear and mucoid. Nothing aggravates the symptoms. She has tried nothing for the symptoms.      RN Note: Pt states she was laying in bed and felt a gush of fluid, changed underwear and it happened again.  Clear fluid until brown mucusy discharge.  Denies pain    Past Medical History: Past Medical History:  Diagnosis Date  . Gonorrhea 2/14  . PID (acute pelvic inflammatory disease)     Past obstetric history: OB History  Gravida Para Term Preterm AB Living  1 0          SAB TAB Ectopic Multiple Live Births               # Outcome Date GA Lbr Len/2nd Weight Sex Delivery Anes PTL Lv  1 Current             Past Surgical History: Past Surgical History:  Procedure Laterality Date  . WISDOM TOOTH EXTRACTION  6/14    Family History: Family History  Problem Relation Age of Onset  . Diabetes Maternal Uncle   . Breast cancer Maternal Grandmother     Social History: Social History   Tobacco Use  . Smoking status: Never Smoker  . Smokeless tobacco: Never Used  Substance Use Topics  . Alcohol use: Not Currently     Alcohol/week: 1.0 oz    Types: 2 Standard drinks or equivalent per week  . Drug use: No    Allergies: No Known Allergies  Meds:  Medications Prior to Admission  Medication Sig Dispense Refill Last Dose  . Prenatal MV-Min-FA-Omega-3 (PRENATAL GUMMIES/DHA & FA PO) Take 1 tablet by mouth daily.    Taking  . valACYclovir (VALTREX) 500 MG tablet Take 1 tablet (500 mg total) by mouth 2 (two) times daily. 60 tablet 6 Taking    I have reviewed patient's Past Medical Hx, Surgical Hx, Family Hx, Social Hx, medications and allergies.   ROS:  Review of Systems  Constitutional: Negative for chills and fever.  Gastrointestinal: Negative for constipation, diarrhea, nausea and vomiting.  Genitourinary: Positive for vaginal bleeding.   Other systems negative  Physical Exam   Patient Vitals for the past 24 hrs:  BP Temp Temp src Pulse Resp SpO2  05/13/17 0659 120/78 - - 84 - -  05/13/17 0618 118/72 98.3 F (36.8 C) Oral 93 16 98 %   Constitutional: Well-developed, well-nourished female in no acute distress.  Cardiovascular: normal rate and rhythm Respiratory: normal effort, clear to auscultation bilaterally GI: Abd soft, non-tender, gravid appropriate for gestational age.   No rebound or guarding. MS: Extremities nontender, no edema, normal ROM Neurologic: Alert and oriented x 4.  GU: Neg CVAT.  PELVIC EXAM: Cervix pink, visually closed, without lesion, scant  white creamy discharge, vaginal walls and external genitalia normal   NO BLOOD VISIBLE.  NO POOLING, NO FERNING  Dilation: Closed Effacement (%): 60 Cervical Position: Posterior Station: 0 Presentation: Vertex Exam by:: Wynelle Bourgeois, CNM  FHT:  Baseline 135 , moderate variability, accelerations present, no decelerations Contractions: Irregular    Labs: Results for orders placed or performed during the hospital encounter of 05/13/17 (from the past 24 hour(s))  Urinalysis, Routine w reflex microscopic     Status: Abnormal    Collection Time: 05/13/17  6:06 AM  Result Value Ref Range   Color, Urine YELLOW YELLOW   APPearance CLEAR CLEAR   Specific Gravity, Urine 1.015 1.005 - 1.030   pH 7.0 5.0 - 8.0   Glucose, UA NEGATIVE NEGATIVE mg/dL   Hgb urine dipstick NEGATIVE NEGATIVE   Bilirubin Urine NEGATIVE NEGATIVE   Ketones, ur 20 (A) NEGATIVE mg/dL   Protein, ur NEGATIVE NEGATIVE mg/dL   Nitrite NEGATIVE NEGATIVE   Leukocytes, UA NEGATIVE NEGATIVE  POCT fern test     Status: None   Collection Time: 05/13/17  6:41 AM  Result Value Ref Range   POCT Fern Test Negative = intact amniotic membranes    A/Positive/-- (10/31 1643)  Imaging:  No results found.  MAU Course/MDM: I have ordered labs and reviewed results.  NST reviewed, reactive  Treatments in MAU included speculum exam.    Assessment: 1. Vaginal discharge during pregnancy in third trimester   2.    No evidence of bloody show  Plan: Discharge home Labor precautions and fetal kick counts Follow up in Office for prenatal visits and recheck Follow-up Information    [facility] Follow up.   Specialty:  Obstetrics and Gynecology Contact information: [address] [place] [address] [phone]        Encouraged to return here or to other Urgent Care/ED if she develops worsening of symptoms, increase in pain, fever, or other concerning symptoms.   Pt stable at time of discharge.  Wynelle Bourgeois CNM, MSN Certified Nurse-Midwife 05/13/2017 7:05 AM

## 2017-05-13 NOTE — Anesthesia Procedure Notes (Signed)
Epidural Patient location during procedure: OB Start time: 05/13/2017 8:17 PM End time: 05/13/2017 8:30 PM  Staffing Anesthesiologist: Lewie LoronGermeroth, Zylah Elsbernd, MD Performed: anesthesiologist   Preanesthetic Checklist Completed: patient identified, pre-op evaluation, timeout performed, IV checked, risks and benefits discussed and monitors and equipment checked  Epidural Patient position: sitting Prep: site prepped and draped and DuraPrep Patient monitoring: heart rate, continuous pulse ox and blood pressure Approach: midline Location: L3-L4 Injection technique: LOR air and LOR saline  Needle:  Needle type: Tuohy  Needle gauge: 17 G Needle length: 9 cm Needle insertion depth: 5 cm Catheter type: closed end flexible Catheter size: 19 Gauge Catheter at skin depth: 10 cm Test dose: negative  Assessment Sensory level: T8 Events: blood not aspirated, injection not painful, no injection resistance, negative IV test and no paresthesia  Additional Notes Reason for block:procedure for pain

## 2017-05-13 NOTE — Progress Notes (Signed)
LABOR PROGRESS NOTE  Judy Boyle is a 27 y.o. G1P0 at 71106w1d  admitted for IOL for PROM @1100 - clear fluid   Subjective: Patient reports feeling cramping and starting to feel some contractions - reports they are not painful   Objective: BP 125/75   Pulse 94   Temp 98.3 F (36.8 C) (Oral)   Resp 16   Ht 5\' 6"  (1.676 m)   Wt 136 lb (61.7 kg)   LMP 08/19/2016   BMI 21.95 kg/m  or  Vitals:   05/13/17 1524 05/13/17 1627 05/13/17 1630 05/13/17 1701  BP:   125/78 125/75  Pulse:   92 94  Resp: 16 16  16   Temp:  98.3 F (36.8 C)    TempSrc:  Oral    Weight:      Height:        Dilation: Closed Effacement (%): 80 Cervical Position: Posterior Presentation: Vertex Exam by:: Steward DroneVeronica [place] Zou, cnm FHT: baseline rate 135, moderate varibility, +acel, no decel Toco: 2-4 minutes/ mild by palpation   Labs: Lab Results  Component Value Date   WBC 7.3 05/13/2017   HGB 10.0 (L) 05/13/2017   HCT 30.5 (L) 05/13/2017   MCV 82.7 05/13/2017   PLT 276 05/13/2017    Patient Active Problem List   Diagnosis Date Noted  . PROM (premature rupture of membranes) 05/13/2017  . History of ELISA positive for HSV 12/14/2016  . Supervision of normal first pregnancy, antepartum 11/18/2016    Assessment / Plan: 27 y.o. G1P0 at 55106w1d here for IOL PROM   Labor: Cytotec induction ordered  Fetal Wellbeing:  Cat I Pain Control:  Medications ordered PRN  Anticipated MOD:  SVD  Sharyon CableRogers, Reynold Mantell C, CNM 05/13/2017, 5:38 PM

## 2017-05-13 NOTE — Progress Notes (Signed)
S: Patient seen & examined for progress of labor. Patient comfortable with epidural.     O:  Vitals:   05/13/17 2055 05/13/17 2100 05/13/17 2105 05/13/17 2200  BP: 129/84 122/79  116/76  Pulse: 90 88  98  Resp:    16  Temp:      TempSrc:      SpO2: 100% 100% 99% 98%  Weight:      Height:        Dilation: 3 Effacement (%): 90 Cervical Position: Middle Station: 0 Presentation: Vertex Exam by:: Gwendolyn GrantMadison Hicks, RN    FHT: 135bpm, mod var, +accels, no decels TOCO: q2-743min with progression to couplets and contractions q3-564min   A/P: This is a 27yo G1PO with IUP at 38+1 weeks by LMP presenting with PROM, with rupture of membranes around 11am.   Received cytotec initially for cervical ripening Significant discomfort with contractions Now comfortable with epidural in place Will start pitocin  Continue expectant management, otherwise Anticipate SVD   Gorden HarmsMegan Vala Raffo, MD PGY-3 05/13/2017 10:27 PM

## 2017-05-13 NOTE — Telephone Encounter (Signed)
Patient called wanting to talk personally with Dr. Jolayne Pantheronstant, [facility] that Dr. Jolayne Pantheronstant would be in at 8 am today. Told her she was not at this location, try [facility].

## 2017-05-13 NOTE — H&P (Signed)
LABOR AND DELIVERY ADMISSION HISTORY AND PHYSICAL NOTE  Judy Boyle is a 27 y.o. female G1P0 with IUP at [redacted]w[redacted]d by LMP presenting to MAU for possible rupture of membranes. She reports having a trickle of clear fluid around 0430 this morning, which she presented to the MAU for, Crist Fat was negative at that time. Reports continuing to have fluid trickle down her leg around 1100 this morning. Amnisure positive on arrival to MAU.  She reports positive fetal movement. She denies abdominal pain, contractions or vaginal bleeding. Admitted to L&D for PROM @1100 .   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 Gonorrhea 2/14  . PID (acute pelvic inflammatory disease)     Past Surgical History: Past Surgical History:  Procedure Laterality Date  . WISDOM TOOTH EXTRACTION  6/14    Obstetrical History: OB History    Gravida  1   Para  0   Term      Preterm      AB      Living        SAB      TAB      Ectopic      Multiple      Live Births  0           Social History: Social History   Socioeconomic History  . Marital status: Single    Spouse name: Not on file  . Number of children: Not on file  . Years of education: Not on file  . Highest education level: Not on file  Occupational History  . Not on file  Social Needs  . Financial resource strain: Not on file  . Food insecurity:    Worry: Not on file    Inability: Not on file  . Transportation needs:    Medical: Not on file    Non-medical: Not on file  Tobacco Use  . Smoking status: Never Smoker  . Smokeless tobacco: Never Used  Substance and Sexual Activity  . Alcohol use: Not Currently    Alcohol/week: 1.0 oz    Types: 2 Standard drinks or equivalent per week  . Drug use: No  . Sexual activity: Not Currently    Partners: Male    Birth  control/protection: None  Lifestyle  . Physical activity:    Days per week: Not on file    Minutes per session: Not on file  . Stress: Not on file  Relationships  . Social connections:    Talks on phone: Not on file    Gets together: Not on file    Attends religious service: Not on file    Active member of club or organization: Not on file    Attends meetings of clubs or organizations: Not on file    Relationship status: Not on file  Other Topics Concern  . Not on file  Social History Narrative  . Not on file    Family History: Family History  Problem Relation Age of Onset  . Diabetes Maternal Uncle   . Breast cancer Maternal Grandmother     Allergies: No Known Allergies  Medications Prior to Admission  Medication Sig Dispense Refill Last Dose  . Prenatal MV-Min-FA-Omega-3 (PRENATAL GUMMIES/DHA & FA PO) Take 1 tablet by mouth daily.    05/13/2017 at Unknown time  . valACYclovir (VALTREX) 500 MG tablet Take 1 tablet (500 mg total)  by mouth 2 (two) times daily. 60 tablet 6 05/13/2017 at Unknown time     Review of Systems  All systems reviewed and negative except as stated in HPI  Physical Exam Blood pressure 124/70, pulse 90, temperature 98.7 F (37.1 C), resp. rate 18, height 5\' 6"  (1.676 m), weight 136 lb (61.7 kg), last menstrual period 08/19/2016. General appearance: alert, cooperative and no distress Lungs: clear to auscultation bilaterally Heart: regular rate and rhythm Abdomen: soft, non-tender; bowel sounds normal Extremities: No calf swelling or tenderness Presentation: cephalic Fetal monitoring: 145/ moderate/ +accels/ no decelerations  Uterine activity: irregular mild contractions     Prenatal labs: ABO, Rh: A/Positive/-- (10/31 1643) Antibody: Negative (10/31 1643) Rubella: 6.10 (10/31 1643) RPR: Non Reactive (02/05 1016)  HBsAg: Negative (10/31 1643)  HIV: Non Reactive (02/05 1016)  GC/Chlamydia: negative (4/8) GBS:   Negative (04/26/17) 2 hr  Glucola: 70-128-113 Genetic screening:  Negative  Anatomy US: Normal female   Clinic [facility]  Prenatal Labs  Dating LMP  Blood type: A/Positive/-- (10/31 1643) A postiive  Genetic Screen AFP: neg     NIPS: low risks Antibody:Negative (10/31 1643)negative  Anatomic US  normal 01/04/17 Rubella: 6.10 (10/31 1643)immune  GTT Early:               Third trimester: nl RPR: Non Reactive (02/05 1016) negative  Flu vaccine Declined  HBsAg: Negative (10/31 1643) non-reactive  TDaP vaccine 03/29/17                                            Rhogam: HIV: Non Reactive (02/05 1016) non-reactive  Baby Food Breast                                          GBS: negative (For PCN allergy, check sensitivities)  Contraception None  Pap: 11/2016 negative  Circumcision Yes if boy    Pediatrician [place] PEDS CF: negative  Support Person Tyrik Reaves  SMA wnl  Prenatal Classes Planning  Hgb electrophoresis: normal    Prenatal Transfer Tool  Maternal Diabetes: No Genetic Screening: Normal Maternal Ultrasounds/Referrals: Normal Fetal Ultrasounds or other Referrals:  None Maternal Substance Abuse:  No Significant Maternal Medications:  None Significant Maternal Lab Results: Lab values include: Group B Strep negative  Results for orders placed or performed during the hospital encounter of 05/13/17 (from the past 24 hour(s))  POCT fern test   Collection Time: 05/13/17 12:58 PM  Result Value Ref Range   POCT Fern Test Negative = intact amniotic membranes   Amnisure rupture of membrane (rom)not at [facility]   Collection Time: 05/13/17  1:03 PM  Result Value Ref Range   Amnisure ROM POSITIVE   CBC   Collection Time: 05/13/17  2:05 PM  Result Value Ref Range   WBC 7.3 4.0 - 10.5 K/uL   RBC 3.69 (L) 3.87 - 5.11 MIL/uL   Hemoglobin 10.0 (L) 12.0 - 15.0 g/dL   HCT 16.130.5 (L) 09.636.0 - 04.546.0 %   MCV 82.7 78.0 - 100.0 fL   MCH 27.1 26.0 - 34.0 pg   MCHC 32.8 30.0 - 36.0 g/dL   RDW 40.914.4 81.111.5 - 91.415.5 %   Platelets 276 150 - 400  K/uL  Results for orders placed or performed during the  hospital encounter of 05/13/17 (from the past 24 hour(s))  Urinalysis, Routine w reflex microscopic   Collection Time: 05/13/17  6:06 AM  Result Value Ref Range   Color, Urine YELLOW YELLOW   APPearance CLEAR CLEAR   Specific Gravity, Urine 1.015 1.005 - 1.030   pH 7.0 5.0 - 8.0   Glucose, UA NEGATIVE NEGATIVE mg/dL   Hgb urine dipstick NEGATIVE NEGATIVE   Bilirubin Urine NEGATIVE NEGATIVE   Ketones, ur 20 (A) NEGATIVE mg/dL   Protein, ur NEGATIVE NEGATIVE mg/dL   Nitrite NEGATIVE NEGATIVE   Leukocytes, UA NEGATIVE NEGATIVE  POCT fern test   Collection Time: 05/13/17  6:41 AM  Result Value Ref Range   POCT Fern Test Negative = intact amniotic membranes     Patient Active Problem List   Diagnosis Date Noted  . PROM (premature rupture of membranes) 05/13/2017  . History of ELISA positive for HSV 12/14/2016  . Supervision of normal first pregnancy, antepartum 11/18/2016    Assessment: Isidra Balaban is a 27 y.o. G1P0 at [redacted]w[redacted]d here for PROM @1100 - clear fluid   #Labor: Recheck cervix around 1500 to assess cervical change and need for augmentation  #Pain: Medications ordered PRN  #FWB: Cat I #ID:  GBS neg  #MOF: Breast  #MOC:Condoms  #Circ:  Yes (outpatient)   Sharyon Cable, CNM 05/13/2017, 2:31 PM

## 2017-05-13 NOTE — Anesthesia Pain Management Evaluation Note (Signed)
  CRNA Pain Management Visit Note  Patient: Judy Boyle, 27 y.o., female  "Hello I am a [facility] of the anesthesia [facility]Fredrich Romansam [facility]. We have an anesthesia team available at all times to provide care throughout the hospital, including epidural management and anesthesia for C-section. I don't know your plan for the delivery whether it a natural birth, water birth, IV sedation, nitrous supplementation, doula or epidural, but we want to meet your pain goals."   1.Was your pain managed to your expectations on prior hospitalizations?   No prior hospitalizations  2.What is your expectation for pain management during this hospitalization?     Epidural and IV pain meds  3.How can we help you reach that goal? support  Record the patient's initial score and the patient's pain goal.   Pain: 0  Pain Goal: 5 [facility] wants you to be able to say your pain was always managed very well.  Trellis PaganiniBREWER,Satoru Milich N 05/13/2017

## 2017-05-13 NOTE — MAU Note (Signed)
Pt states she was laying in bed and felt a gush of fluid, changed underwear and it happened again.  Clear fluid until brown mucusy discharge.  Denies pain.

## 2017-05-13 NOTE — MAU Note (Signed)
Pt reports she was here earlier for leaking  SROM ruled out. Pt stated she is still wet had to change underwear 5 times since she got home. repots some tightness and decreased FM.

## 2017-05-14 ENCOUNTER — Encounter (HOSPITAL_COMMUNITY): Payer: Self-pay

## 2017-05-14 DIAGNOSIS — Z3A38 38 weeks gestation of pregnancy: Secondary | ICD-10-CM

## 2017-05-14 LAB — RPR: RPR Ser Ql: NONREACTIVE

## 2017-05-14 MED ORDER — ZOLPIDEM TARTRATE 5 MG PO TABS: 5.0000 mg | ORAL_TABLET | Freq: Every evening | ORAL | Status: DC | PRN

## 2017-05-14 MED ORDER — METHYLERGONOVINE MALEATE 0.2 MG/ML IJ SOLN: 0.2000 mg | INTRAMUSCULAR | Status: DC | PRN

## 2017-05-14 MED ORDER — TETANUS-DIPHTH-ACELL PERTUSSIS 5-2.5-18.5 LF-MCG/0.5 IM SUSP: 0.5000 mL | Freq: Once | INTRAMUSCULAR | Status: DC

## 2017-05-14 MED ORDER — WITCH HAZEL-GLYCERIN EX PADS: 1.0000 "application " | MEDICATED_PAD | CUTANEOUS | Status: DC | PRN

## 2017-05-14 MED ORDER — ONDANSETRON HCL 4 MG PO TABS: 4.0000 mg | ORAL_TABLET | ORAL | Status: DC | PRN

## 2017-05-14 MED ORDER — BISACODYL 10 MG RE SUPP: 10.0000 mg | Freq: Every day | RECTAL | Status: DC | PRN

## 2017-05-14 MED ORDER — ACETAMINOPHEN 325 MG PO TABS
650.0000 mg | ORAL_TABLET | ORAL | Status: DC | PRN
  Administered 2017-05-14: 650 mg via ORAL
  Filled 2017-05-14 (×2): qty 2

## 2017-05-14 MED ORDER — SIMETHICONE 80 MG PO CHEW: 80.0000 mg | CHEWABLE_TABLET | ORAL | Status: DC | PRN

## 2017-05-14 MED ORDER — DIBUCAINE 1 % RE OINT: 1.0000 "application " | TOPICAL_OINTMENT | RECTAL | Status: DC | PRN

## 2017-05-14 MED ORDER — MEASLES, MUMPS & RUBELLA VAC ~~LOC~~ INJ
0.5000 mL | INJECTION | Freq: Once | SUBCUTANEOUS | Status: DC
  Filled 2017-05-14: qty 0.5

## 2017-05-14 MED ORDER — DIPHENHYDRAMINE HCL 25 MG PO CAPS: 25.0000 mg | ORAL_CAPSULE | Freq: Four times a day (QID) | ORAL | Status: DC | PRN

## 2017-05-14 MED ORDER — METHYLERGONOVINE MALEATE 0.2 MG PO TABS: 0.2000 mg | ORAL_TABLET | ORAL | Status: DC | PRN

## 2017-05-14 MED ORDER — PRENATAL MULTIVITAMIN CH
1.0000 | ORAL_TABLET | Freq: Every day | ORAL | Status: DC
  Administered 2017-05-14: 1 via ORAL
  Filled 2017-05-14: qty 1

## 2017-05-14 MED ORDER — IBUPROFEN 600 MG PO TABS
600.0000 mg | ORAL_TABLET | Freq: Four times a day (QID) | ORAL | Status: DC
  Administered 2017-05-14 – 2017-05-15 (×5): 600 mg via ORAL
  Filled 2017-05-14 (×5): qty 1

## 2017-05-14 MED ORDER — DOCUSATE SODIUM 100 MG PO CAPS
100.0000 mg | ORAL_CAPSULE | Freq: Two times a day (BID) | ORAL | Status: DC
  Administered 2017-05-14: 100 mg via ORAL
  Filled 2017-05-14: qty 1

## 2017-05-14 MED ORDER — FLEET ENEMA 7-19 GM/118ML RE ENEM: 1.0000 | ENEMA | Freq: Every day | RECTAL | Status: DC | PRN

## 2017-05-14 MED ORDER — BENZOCAINE-MENTHOL 20-0.5 % EX AERO
1.0000 "application " | INHALATION_SPRAY | CUTANEOUS | Status: DC | PRN
  Administered 2017-05-14: 1 via TOPICAL
  Filled 2017-05-14: qty 56

## 2017-05-14 MED ORDER — COCONUT OIL OIL: 1.0000 "application " | TOPICAL_OIL | Status: DC | PRN

## 2017-05-14 MED ORDER — ONDANSETRON HCL 4 MG/2ML IJ SOLN
4.0000 mg | INTRAMUSCULAR | Status: DC | PRN
Start: 2017-05-14 — End: 2017-05-15

## 2017-05-14 MED ORDER — FERROUS SULFATE 325 (65 FE) MG PO TABS
325.0000 mg | ORAL_TABLET | Freq: Two times a day (BID) | ORAL | Status: DC
  Administered 2017-05-14 (×2): 325 mg via ORAL
  Filled 2017-05-14 (×2): qty 1

## 2017-05-14 NOTE — Progress Notes (Signed)
Patient ID: Judy Boyle, female   DOB: 05-06-1990, 27 y.o.   MRN: [id number]  Pt assessed in order to remove vag packing. Bleeding has diminished and is scant now; foley was recently removed. VSS, afeb. Single sponge removed- pt tolerated well.  Will attempt to void in the near future and nursing will communicate any difficulties with this.  Plan to eval in the morning on rounds.  Cam HaiSHAW, Antuan Limes CNM 05/14/2017

## 2017-05-14 NOTE — Lactation Note (Signed)
This note was copied from a baby's chart. Lactation Consultation Note Baby 6 hrs old. Mom states baby isn't interested in BF yet. Discussed newborn behavior, feeding habits, STS, I&O, supply and demand.  Mom has pendulous breast w/compressible flat nipples. Hand expression w/colostrum noted. Discussed hand expression, supplementing. LPI sheet given d/t weight for supplementing guidelines.  Mom encouraged to waken baby for feeds if hasn't cued before 3 hrs. Discussed pumping for stimulation. Encouraged to call for assistance or questions. WH/LC brochure given w/resources, support groups and LC services.  Patient Name: Judy Boyle ZOXWR'UToday's Date: 05/14/2017 Reason for consult: Initial assessment;Infant < 6lbs   Maternal Data Has patient been taught Hand Expression?: Yes Does the patient have breastfeeding experience prior to this delivery?: No  Feeding Feeding Type: Breast Fed Length of feed: 5 min  LATCH Score Latch: Repeated attempts needed to sustain latch, nipple held in mouth throughout feeding, stimulation needed to elicit sucking reflex.  Audible Swallowing: A few with stimulation  Type of Nipple: Flat  Comfort (Breast/Nipple): Soft / non-tender  Hold (Positioning): Full assist, staff holds infant at breast  LATCH Score: 6  Interventions    Lactation Tools Discussed/Used     Consult Status Consult Status: Follow-up Date: 05/15/17 Follow-up type: In-patient    Reynoldo Mainer, Diamond NickelLAURA G 05/14/2017, 7:05 AM

## 2017-05-14 NOTE — Progress Notes (Signed)
Noted to be C/C? +2 around 2340 after prolonged decel. No urge to push, but pushes well. O2, IVF bolus pit off . Dr .Jolayne Pantherconstant called d/t persistent bradycardia for several minutes whenever she pushes.

## 2017-05-14 NOTE — Anesthesia Postprocedure Evaluation (Signed)
Anesthesia Post Note  Patient: Judy Boyle  Procedure(s) Performed: AN AD HOC LABOR EPIDURAL     Patient location during evaluation: Mother Baby Anesthesia Type: Epidural Level of consciousness: awake and alert Pain management: pain level controlled Vital Signs Assessment: post-procedure vital signs reviewed and stable Respiratory status: spontaneous breathing, nonlabored ventilation and respiratory function stable Cardiovascular status: stable Postop Assessment: no headache, no backache, epidural receding, adequate PO intake, no apparent nausea or vomiting and patient able to bend at knees Anesthetic complications: no    Last Vitals:  Vitals:   05/14/17 0415 05/14/17 0800  BP: 109/70 124/80  Pulse: 74 90  Resp:  18  Temp:  36.5 C  SpO2:      Last Pain:  Vitals:   05/14/17 0800  TempSrc: Oral  PainSc:    Pain Goal:                 Land O'LakesMalinova,Marlisa Caridi Hristova

## 2017-05-14 NOTE — Lactation Note (Signed)
This note was copied from a baby's chart. Lactation Consultation Note  Patient Name: Judy Boyle WUJWJ'XToday's Date: 05/14/2017 Reason for consult: Follow-up assessment;Early term 37-38.6wks;Infant < 6lbs  LC Follow Up Visit :  Follow up visit to check on infant's feeding ability.  Infant has not breastfed well since 1400.  Mother states that he is sleepy.  Infant is awake so LC offered to assist with latch.  After a few repeated attempts was able to latch on the right breast in the football hold for approximately 10 min with on/off good sucks.  Mother stated she could feel a good strong suck without pain.    Discussed with mother the importance of starting to pump now to increase milk volume, stimulate breasts and to obtain colostum for supplementation.  Pump set up and instructions given on pumping and cleaning pump parts.  Instructed her to call her nurse to supplement with any EBM she may obtain.  Also reminded her that she may pump and obtain no colostrum at this time.  If she does not obtain colostrum it will be necessary to supplement 5-10 mls of formula.  Advised her to be diligent all night tonight about feeding at least every 3 hours and earlier if he shows feeding cues.  Also reminded to pump after every breastfeeding attempt.  She does not want formula given but understands that the baby must be supplemented for age and weight.  Update given to RN and asked to follow up with supplementation if mother does not get adequate amount of colostrum from pumping.  Family/friends at bedside and her mother is present and supportive.  Maternal Data Formula Feeding for Exclusion: No Has patient been taught Hand Expression?: Yes Does the patient have breastfeeding experience prior to this delivery?: No  Feeding Feeding Type: Breast Fed Length of feed: 10 min  LATCH Score Latch: Repeated attempts needed to sustain latch, nipple held in mouth throughout feeding, stimulation needed to elicit  sucking reflex.  Audible Swallowing: None  Type of Nipple: Everted at rest and after stimulation  Comfort (Breast/Nipple): Soft / non-tender  Hold (Positioning): Assistance needed to correctly position infant at breast and maintain latch.  LATCH Score: 6  Interventions Interventions: Breast feeding basics reviewed;Assisted with latch;Skin to skin;Breast massage;Hand express;Position options;Support pillows;Adjust position;Breast compression;DEBP  Lactation Tools Discussed/Used Tools: Pump Breast pump type: Double-Electric Breast Pump Pump Review: Setup, frequency, and cleaning;Milk Storage Initiated by:: Lakera Viall Date initiated:: 05/14/17   Consult Status Consult Status: Follow-up Date: 05/15/17 Follow-up type: In-patient    Dora SimsBeth R Kynleigh Artz 05/14/2017, 5:55 PM

## 2017-05-15 ENCOUNTER — Other Ambulatory Visit: Payer: Self-pay

## 2017-05-15 MED ORDER — FERROUS SULFATE 325 (65 FE) MG PO TABS: 325.0000 mg | ORAL_TABLET | Freq: Two times a day (BID) | ORAL | 0 refills | Status: DC

## 2017-05-15 MED ORDER — IBUPROFEN 600 MG PO TABS: 600.0000 mg | ORAL_TABLET | Freq: Four times a day (QID) | ORAL | 0 refills | Status: DC

## 2017-05-15 NOTE — Discharge Summary (Signed)
OB Discharge Summary     Patient Name: Judy Boyle DOB: 05-20-1990 MRN: [id number]  Date of admission: 05/13/2017 Delivering MD: Catalina Antigua   Date of discharge: 05/15/2017  Admitting diagnosis: 38WKS LEAKING  Intrauterine pregnancy: [redacted]w[redacted]d     Secondary diagnosis:  Active Problems:   PROM (premature rupture of membranes)  Additional problems: +HSV     Discharge diagnosis: Term Pregnancy Delivered                                                                                                Post partum procedures:None  Augmentation: Pitocin and Cytotec  Complications: None  Hospital course:  Onset of Labor With Vaginal Delivery     27 y.o. yo G1P1001 at [redacted]w[redacted]d was admitted in Latent Labor on 05/13/2017. Patient had an uncomplicated labor course as follows:  Membrane Rupture Time/Date: 11:00 AM ,05/13/2017   Intrapartum Procedures: Episiotomy: None [1]                                         Lacerations:  2nd degree [3]  Patient had a delivery of a Viable infant. 05/14/2017  Information for the patient's newborn:  [place], [place] [[id number]]  Delivery Method: Vaginal, Spontaneous(Filed from Delivery Summary)    Pateint had an uncomplicated postpartum course.  She is ambulating, tolerating a regular diet, passing flatus, and urinating well. Patient is discharged home in stable condition on 05/15/17.   Physical exam  Vitals:   05/14/17 0800 05/14/17 1700 05/14/17 1736 05/15/17 0611  BP: 124/80 118/70 115/73 116/74  Pulse: 90 88 82 81  Resp: Temp: 97.7 F (36.5 C) 97.9 F (36.6 C) 98.4 F (36.9 C) 98.2 F (36.8 C)  TempSrc: Oral Oral Oral Oral  SpO2:      Weight:      Height:       General: alert, cooperative and no distress Lochia: appropriate Uterine Fundus: firm Incision: N/A DVT Evaluation: No evidence of DVT seen on physical exam. Labs: Lab Results  Component Value Date   WBC 7.3 05/13/2017   HGB 10.0 (L) 05/13/2017   HCT  30.5 (L) 05/13/2017   MCV 82.7 05/13/2017   PLT 276 05/13/2017   CMP Latest Ref Rng & Units 04/13/2017  Glucose 65 - 99 mg/dL 82  BUN 6 - 20 mg/dL 6  Creatinine 4.40 - 1.02 mg/dL 7.25  Sodium 366 - 440 mmol/L 131(L)  Potassium 3.5 - 5.1 mmol/L 3.3(L)  Chloride 101 - 111 mmol/L 101  CO2 22 - 32 mmol/L 19(L)  Calcium 8.9 - 10.3 mg/dL 8.1(L)  Total Protein 6.5 - 8.1 g/dL 6.4(L)  Total Bilirubin 0.3 - 1.2 mg/dL 3.4(V)  Alkaline Phos 38 - 126 U/L 160(H)  AST 15 - 41 U/L 33  ALT 14 - 54 U/L 24    Discharge instruction: per After Visit Summary and "Baby and Me Booklet".  After visit meds:  Allergies as of 05/15/2017   No Known Allergies  Medication List    STOP taking these medications   valACYclovir 500 MG tablet Commonly known as:  VALTREX     TAKE these medications   ferrous sulfate 325 (65 FE) MG tablet Take 1 tablet (325 mg total) by mouth 2 (two) times daily with a meal.   ibuprofen 600 MG tablet Commonly known as:  ADVIL,MOTRIN Take 1 tablet (600 mg total) by mouth every 6 (six) hours.   PRENATAL GUMMIES/DHA & FA PO Take 1 tablet by mouth daily.       Diet: routine diet  Activity: Advance as tolerated. Pelvic rest for 6 weeks.   Outpatient follow up:4 weeks Follow up Appt: Future Appointments  Date Time Provider Department Center  06/17/2017 10:00 AM Hermina Staggers, MD [facility] None   Follow up Visit:No follow-ups on file.  Postpartum contraception: Condoms  Newborn Data: Live born female  Birth Weight: 5 lb 13.3 oz (2645 g) APGAR: 9, 9  Newborn Delivery   Birth date/time:  05/14/2017 00:31:00 Delivery type:  Vaginal, Spontaneous     Baby Feeding: Breast Disposition:home with mother   05/15/2017 Caryl Ada, DO

## 2017-05-15 NOTE — Lactation Note (Signed)
This note was copied from a baby's chart. Lactation Consultation Note  Patient Name: Judy Boyle ZOXWR'U Date: 05/15/2017    [facility] Visit Prior to Discharge:   G1P1 mother whose infant is now 82 hours old.  This is a 8+82 week old baby who is now < 6 lbs. with a 7% weight loss   Mother has been breastfeeding throughout the night with no supplementation since early evening.  She feels baby is getting latched on well and awakens for feeds.  She has been pumping and feeding back EBM to baby as discussed yesterday.    LC encouraged to continue feeding 8-12 times/24 hours or earlier if baby shows cues.  Reinforced continuing breast massage, hand expression pre and post pumping and to pump after feeding.  Mother will continue supplementing with any EBM she obtains.  She enjoys using the curved tip syringe for supplemental feedings.  Encouraged her to be very diligent about regular pumping to increase milk volume and supply and to provide extra calories for baby.  Her mother has been a good support person and has also breastfed.  FOB is very supportive as well.  Engorgement prevention/treatment discussed.  Mother has a DEBP for home use and will return to the pediatrician tomorrow for a weight check.  Mom made aware of O/P services, breastfeeding support groups, community resources, and our phone # for post-discharge questions.                   Judy Boyle 05/15/2017, 9:46 AM

## 2017-05-15 NOTE — Discharge Instructions (Signed)

## 2017-05-17 ENCOUNTER — Encounter: Payer: Medicaid Other | Admitting: Obstetrics and Gynecology

## 2017-05-21 ENCOUNTER — Telehealth: Payer: Self-pay

## 2017-05-21 NOTE — Telephone Encounter (Signed)
TC from Nurse with [facility] regarding EPDS of a 9  TC to pt to check on her  Pt not ava left detailed message for pt to call back.  Appt scheduled for pt to been seen to discuss PP depression for Wed 05/26/17 @ 1:45pm Call to [facility] Nurse) left detailed message to her as well.

## 2017-05-26 ENCOUNTER — Ambulatory Visit: Payer: Medicaid Other | Admitting: Obstetrics & Gynecology

## 2017-06-17 ENCOUNTER — Ambulatory Visit: Payer: Medicaid Other | Admitting: Obstetrics and Gynecology

## 2017-06-28 ENCOUNTER — Other Ambulatory Visit (HOSPITAL_COMMUNITY)
Admission: RE | Admit: 2017-06-28 | Discharge: 2017-06-28 | Disposition: A | Discharge status: Home / Self Care | Payer: Medicaid Other | Source: Ambulatory Visit | Attending: Obstetrics and Gynecology | Admitting: Obstetrics and Gynecology

## 2017-06-28 ENCOUNTER — Ambulatory Visit (INDEPENDENT_AMBULATORY_CARE_PROVIDER_SITE_OTHER): Payer: Medicaid Other | Admitting: Obstetrics & Gynecology

## 2017-06-28 ENCOUNTER — Encounter: Payer: Self-pay | Admitting: Obstetrics & Gynecology

## 2017-06-28 VITALS — BP 101/67 | HR 91 | Ht 66.0 in | Wt 126.2 lb

## 2017-06-28 DIAGNOSIS — R300 Dysuria: Secondary | ICD-10-CM | POA: Insufficient documentation

## 2017-06-28 DIAGNOSIS — N898 Other specified noninflammatory disorders of vagina: Secondary | ICD-10-CM | POA: Diagnosis not present

## 2017-06-28 DIAGNOSIS — Z1389 Encounter for screening for other disorder: Secondary | ICD-10-CM | POA: Diagnosis not present

## 2017-06-28 LAB — POCT URINALYSIS DIPSTICK
BILIRUBIN UA: NEGATIVE
Glucose, UA: NEGATIVE
Ketones, UA: NEGATIVE
Nitrite, UA: NEGATIVE
Protein, UA: NEGATIVE
SPEC GRAV UA: 1.015 (ref 1.010–1.025)
UROBILINOGEN UA: 0.2 U/dL
pH, UA: 6.5 (ref 5.0–8.0)

## 2017-06-28 NOTE — Patient Instructions (Signed)
Contraception Choices Contraception, also called birth control, refers to methods or devices that prevent pregnancy. Hormonal methods Contraceptive implant A contraceptive implant is a thin, plastic tube that contains a hormone. It is inserted into the upper part of the arm. It can remain in place for up to 3 years. Progestin-only injections Progestin-only injections are injections of progestin, a synthetic form of the hormone progesterone. They are given every 3 months by a health care provider. Birth control pills Birth control pills are pills that contain hormones that prevent pregnancy. They must be taken once a day, preferably at the same time each day. Birth control patch The birth control patch contains hormones that prevent pregnancy. It is placed on the skin and must be changed once a week for three weeks and removed on the fourth week. A prescription is needed to use this method of contraception. Vaginal ring A vaginal ring contains hormones that prevent pregnancy. It is placed in the vagina for three weeks and removed on the fourth week. After that, the process is repeated with a new ring. A prescription is needed to use this method of contraception. Emergency contraceptive Emergency contraceptives prevent pregnancy after unprotected sex. They come in pill form and can be taken up to 5 days after sex. They work best the sooner they are taken after having sex. Most emergency contraceptives are available without a prescription. This method should not be used as your only form of birth control. Barrier methods Female condom A female condom is a thin sheath that is worn over the penis during sex. Condoms keep sperm from going inside a woman's body. They can be used with a spermicide to increase their effectiveness. They should be disposed after a single use. Female condom A female condom is a soft, loose-fitting sheath that is put into the vagina before sex. The condom keeps sperm from going  inside a woman's body. They should be disposed after a single use. Diaphragm A diaphragm is a soft, dome-shaped barrier. It is inserted into the vagina before sex, along with a spermicide. The diaphragm blocks sperm from entering the uterus, and the spermicide kills sperm. A diaphragm should be left in the vagina for 6-8 hours after sex and removed within 24 hours. A diaphragm is prescribed and fitted by a health care provider. A diaphragm should be replaced every 1-2 years, after giving birth, after gaining more than 15 lb (6.8 kg), and after pelvic surgery. Cervical cap A cervical cap is a round, soft latex or plastic cup that fits over the cervix. It is inserted into the vagina before sex, along with spermicide. It blocks sperm from entering the uterus. The cap should be left in place for 6-8 hours after sex and removed within 48 hours. A cervical cap must be prescribed and fitted by a health care provider. It should be replaced every 2 years. Sponge A sponge is a soft, circular piece of polyurethane foam with spermicide on it. The sponge helps block sperm from entering the uterus, and the spermicide kills sperm. To use it, you make it wet and then insert it into the vagina. It should be inserted before sex, left in for at least 6 hours after sex, and removed and thrown away within 30 hours. Spermicides Spermicides are chemicals that kill or block sperm from entering the cervix and uterus. They can come as a cream, jelly, suppository, foam, or tablet. A spermicide should be inserted into the vagina with an applicator at least 10-15 minutes before   sex to allow time for it to work. The process must be repeated every time you have sex. Spermicides do not require a prescription. Intrauterine contraception Intrauterine device (IUD) An IUD is a T-shaped device that is put in a woman's uterus. There are two types:  Hormone IUD.This type contains progestin, a synthetic form of the hormone progesterone. This  type can stay in place for 3-5 years.  Copper IUD.This type is wrapped in copper wire. It can stay in place for 10 years.  Permanent methods of contraception Female tubal ligation In this method, a woman's fallopian tubes are sealed, tied, or blocked during surgery to prevent eggs from traveling to the uterus. Hysteroscopic sterilization In this method, a small, flexible insert is placed into each fallopian tube. The inserts cause scar tissue to form in the fallopian tubes and block them, so sperm cannot reach an egg. The procedure takes about 3 months to be effective. Another form of birth control must be used during those 3 months. Female sterilization This is a procedure to tie off the tubes that carry sperm (vasectomy). After the procedure, the man can still ejaculate fluid (semen). Natural planning methods Natural family planning In this method, a couple does not have sex on days when the woman could become pregnant. Calendar method This means keeping track of the length of each menstrual cycle, identifying the days when pregnancy can happen, and not having sex on those days. Ovulation method In this method, a couple avoids sex during ovulation. Symptothermal method This method involves not having sex during ovulation. The woman typically checks for ovulation by watching changes in her temperature and in the consistency of cervical mucus. Post-ovulation method In this method, a couple waits to have sex until after ovulation. Summary  Contraception, also called birth control, means methods or devices that prevent pregnancy.  Hormonal methods of contraception include implants, injections, pills, patches, vaginal rings, and emergency contraceptives.  Barrier methods of contraception can include female condoms, female condoms, diaphragms, cervical caps, sponges, and spermicides.  There are two types of IUDs (intrauterine devices). An IUD can be put in a woman's uterus to prevent pregnancy  for 3-5 years.  Permanent sterilization can be done through a procedure for males, females, or both.  Natural family planning methods involve not having sex on days when the woman could become pregnant. This information is not intended to replace advice given to you by your health care provider. Make sure you discuss any questions you have with your health care provider. Document Released: 01/05/2005 Document Revised: 02/08/2016 Document Reviewed: 02/08/2016 Elsevier Interactive Patient Education  2018 Elsevier Inc.  

## 2017-06-28 NOTE — Progress Notes (Signed)
Post Partum Exam  Judy Boyle is a 27 y.o. [id number] female who presents for a postpartum visit. She is 6 weeks postpartum following a spontaneous vaginal delivery. I have fully reviewed the prenatal and intrapartum course. The delivery was at 38 gestational weeks.  Anesthesia: epidural. Postpartum course has been uncomplicated. Baby's course has been uncomplicated. Baby is feeding by breast. Bleeding staining only. Bowel function is normal. Bladder function is normal. Patient is not sexually active. Contraception method is none. Postpartum depression screening:neg  The following portions of the patient's history were reviewed and updated as appropriate: allergies, current medications, past family history, past medical history, past social history, past surgical history and problem list. Last pap smear done 10/2016 and was Normal  Review of Systems dysuria at laceration site, vaginal discharge    Objective:  Blood pressure 101/67, pulse 91, height 5\' 6"  (1.676 m), weight 126 lb 3.2 oz (57.2 kg), currently breastfeeding.  General:  alert, cooperative and no distress   Breasts:     Lungs: nl effort  Heart:     Abdomen: soft, non-tender; bowel sounds normal; no masses,  no organomegaly   Vulva:  mild tenderness at perineum at site of repair, no redness or swelling  Vagina: normal vagina  Cervix:  no lesions  Corpus: normal  Adnexa:  not evaluated  Rectal Exam: Not performed.        Assessment:    normal 6 week postpartum exam. Pap smear not done at today's visit.   Plan:   1. Contraception: condoms 2. Info on BCM given 3. Follow up as needed, STD test and urine culture sent  Adam PhenixArnold, James G, MD 06/28/2017

## 2017-06-29 LAB — CERVICOVAGINAL ANCILLARY ONLY
BACTERIAL VAGINITIS: NEGATIVE
CANDIDA VAGINITIS: NEGATIVE
TRICH (WINDOWPATH): NEGATIVE

## 2017-06-30 LAB — URINE CULTURE

## 2018-02-08 ENCOUNTER — Encounter: Payer: Self-pay | Admitting: Family Medicine

## 2018-02-10 ENCOUNTER — Ambulatory Visit (INDEPENDENT_AMBULATORY_CARE_PROVIDER_SITE_OTHER): Payer: Medicaid Other | Admitting: Family Medicine

## 2018-02-10 ENCOUNTER — Encounter: Payer: Self-pay | Admitting: Family Medicine

## 2018-02-10 VITALS — BP 122/78 | HR 82 | Resp 17 | Ht 67.0 in | Wt 123.2 lb

## 2018-02-10 DIAGNOSIS — Z130 Encounter for screening for diseases of the blood and blood-forming organs and certain disorders involving the immune mechanism: Secondary | ICD-10-CM

## 2018-02-10 DIAGNOSIS — Z0000 Encounter for general adult medical examination without abnormal findings: Secondary | ICD-10-CM | POA: Diagnosis not present

## 2018-02-10 DIAGNOSIS — Z1329 Encounter for screening for other suspected endocrine disorder: Secondary | ICD-10-CM

## 2018-02-10 DIAGNOSIS — Z1389 Encounter for screening for other disorder: Secondary | ICD-10-CM | POA: Diagnosis not present

## 2018-02-10 DIAGNOSIS — Z131 Encounter for screening for diabetes mellitus: Secondary | ICD-10-CM

## 2018-02-10 DIAGNOSIS — Z7689 Persons encountering health services in other specified circumstances: Secondary | ICD-10-CM

## 2018-02-10 DIAGNOSIS — Z3202 Encounter for pregnancy test, result negative: Secondary | ICD-10-CM

## 2018-02-10 DIAGNOSIS — Z13220 Encounter for screening for lipoid disorders: Secondary | ICD-10-CM

## 2018-02-10 DIAGNOSIS — Z789 Other specified health status: Secondary | ICD-10-CM

## 2018-02-10 LAB — POCT URINALYSIS DIP (CLINITEK)
BILIRUBIN UA: NEGATIVE
Blood, UA: NEGATIVE
GLUCOSE UA: NEGATIVE mg/dL
Ketones, POC UA: NEGATIVE mg/dL
NITRITE UA: NEGATIVE
Spec Grav, UA: 1.02 (ref 1.010–1.025)
UROBILINOGEN UA: 0.2 U/dL
pH, UA: 7 (ref 5.0–8.0)

## 2018-02-10 LAB — POCT URINE PREGNANCY: PREG TEST UR: NEGATIVE

## 2018-02-10 NOTE — Patient Instructions (Addendum)
Thank you for choosing Primary Care at [facility] to be your medical home!    Judy Boyle was seen by Molli Barrows, FNP today.   Judy Boyle's primary care provider is Scot Jun, FNP.   For the best care possible, you should try to see Molli Barrows, FNP-C whenever you come to the clinic.   We look forward to seeing you again soon!  If you have any questions about your visit today, please call us at [phone] or feel free to reach your primary care provider via [place].    Preventive Care 18-39 Years, Female Preventive care refers to lifestyle choices and visits with your health care provider that can promote health and wellness. What does preventive care include?   A yearly physical exam. This is also called an annual well check.  Dental exams once or twice a year.  Routine eye exams. Ask your health care provider how often you should have your eyes checked.  Personal lifestyle choices, including: ? Daily care of your teeth and gums. ? Regular physical activity. ? Eating a healthy diet. ? Avoiding tobacco and drug use. ? Limiting alcohol use. ? Practicing safe sex. ? Taking vitamin and mineral supplements as recommended by your health care provider. What happens during an annual well check? The services and screenings done by your health care provider during your annual well check will depend on your age, overall health, lifestyle risk factors, and family history of disease. Counseling Your health care provider may ask you questions about your:  Alcohol use.  Tobacco use.  Drug use.  Emotional well-being.  Home and relationship well-being.  Sexual activity.  Eating habits.  Work and work Statistician.  Method of birth control.  Menstrual cycle.  Pregnancy history. Screening You may have the following tests or measurements:  Height, weight, and BMI.  Diabetes screening. This is done by checking your blood sugar (glucose)  after you have not eaten for a while (fasting).  Blood pressure.  Lipid and cholesterol levels. These may be checked every 5 years starting at age 35.  Skin check.  Hepatitis C blood test.  Hepatitis B blood test.  Sexually transmitted disease (STD) testing.  BRCA-related cancer screening. This may be done if you have a family history of breast, ovarian, tubal, or peritoneal cancers.  Pelvic exam and Pap test. This may be done every 3 years starting at age 6. Starting at age 52, this may be done every 5 years if you have a Pap test in combination with an HPV test. Discuss your test results, treatment options, and if necessary, the need for more tests with your health care provider. Vaccines Your health care provider may recommend certain vaccines, such as:  Influenza vaccine. This is recommended every year.  Tetanus, diphtheria, and acellular pertussis (Tdap, Td) vaccine. You may need a Td booster every 10 years.  Varicella vaccine. You may need this if you have not been vaccinated.  HPV vaccine. If you are 83 or younger, you may need three doses over 6 months.  Measles, mumps, and rubella (MMR) vaccine. You may need at least one dose of MMR. You may also need a second dose.  Pneumococcal 13-valent conjugate (PCV13) vaccine. You may need this if you have certain conditions and were not previously vaccinated.  Pneumococcal polysaccharide (PPSV23) vaccine. You may need one or two doses if you smoke cigarettes or if you have certain conditions.  Meningococcal vaccine. One dose is recommended if you are age 55-21  years and a Market researcher living in a residence hall, or if you have one of several medical conditions. You may also need additional booster doses.  Hepatitis A vaccine. You may need this if you have certain conditions or if you travel or work in places where you may be exposed to hepatitis A.  Hepatitis B vaccine. You may need this if you have certain  conditions or if you travel or work in places where you may be exposed to hepatitis B.  Haemophilus influenzae type b (Hib) vaccine. You may need this if you have certain risk factors. Talk to your health care provider about which screenings and vaccines you need and how often you need them. This information is not intended to replace advice given to you by your health care provider. Make sure you discuss any questions you have with your health care provider. Document Released: 03/03/2001 Document Revised: 08/18/2016 Document Reviewed: 11/06/2014 Elsevier Interactive Patient Education  2019 [employer].

## 2018-02-10 NOTE — Progress Notes (Signed)
Patient ID: Judy Boyle, female    DOB: 1990-10-18, 28 y.o.   MRN: [id number]  PCP: Bing Neighbors, FNP  Chief Complaint  Patient presents with  . Establish Care  . Annual Exam    Subjective:  HPI Judy Boyle is a 28 y.o. female, nonsmoker, presents for complete physical exam. Patient is status post abnormal vaginal delivery x9 months.  She delivered a healthy baby boy.  She is currently breast-feeding.  She has not had a normal menstrual cycle since giving birth.  She is currently not sexually active.  She complains of some headaches which occur around the time her cycle should occur otherwise no premenstrual symptoms are occurring.  She is a corrective lens wearer and notes that her current prescription likely needs to be altered as she finds her self squinting to read.  Her mother and father are both alive with no chronic conditions.  See family medical history.  She has no history of any childhood illnesses.  Pregnancy was uncomplicated.  She endorses postpartum depression after delivery approximately 1 to 2 months postpartum.  She did have the assistance of a in-home nurse who provided some support and symptoms completely resolved.  She denies any persisting depression or anxiety symptoms.  She endorses some worry regarding chills health although nothing out of the ordinary.  Health Promotion: Health Screening Current/Overdue:   Immunizations: need flu Pap due 2021 Last Dental Exam: Needs a dental exam Last Eye Exam: Needs an eye exam    Current home medications include: Prior to Admission medications   Not on File     Family History  Problem Relation Age of Onset  . Diabetes Maternal Uncle   . Breast cancer Maternal Grandmother      No Known Allergies  Social History   Socioeconomic History  . Marital status: Single    Spouse name: Not on file  . Number of children: Not on file  . Years of education: Not on file  . Highest education level: Not on  file  Occupational History  . Not on file  Social Needs  . Financial resource strain: Not on file  . Food insecurity:    Worry: Not on file    Inability: Not on file  . Transportation needs:    Medical: Not on file    Non-medical: Not on file  Tobacco Use  . Smoking status: Never Smoker  . Smokeless tobacco: Never Used  Substance and Sexual Activity  . Alcohol use: Not Currently    Alcohol/week: 2.0 standard drinks    Types: 2 Standard drinks or equivalent per week  . Drug use: No  . Sexual activity: Not Currently    Partners: Male    Birth control/protection: None  Lifestyle  . Physical activity:    Days per week: Not on file    Minutes per session: Not on file  . Stress: Not on file  Relationships  . Social connections:    Talks on phone: Not on file    Gets together: Not on file    Attends religious service: Not on file    Active member of club or organization: Not on file    Attends meetings of clubs or organizations: Not on file    Relationship status: Not on file  . Intimate partner violence:    Fear of current or ex partner: Not on file    Emotionally abused: Not on file    Physically abused: Not on file    Forced  sexual activity: Not on file  Other Topics Concern  . Not on file  Social History Narrative  . Not on file     Review of Systems Constitutional: Negative for fever, chills, diaphoresis, activity change, appetite change and fatigue. HENT: Negative for ear pain, nosebleeds, congestion, facial swelling, rhinorrhea, neck pain, neck stiffness and ear discharge.  Eyes: Negative for pain, discharge, redness, itching and visual disturbance. Respiratory: Negative for cough, choking, chest tightness, shortness of breath, wheezing and stridor.  Cardiovascular: Negative for chest pain, palpitations and leg swelling. Gastrointestinal: Negative for abdominal distention. Genitourinary: Negative for dysuria, urgency, frequency, hematuria, flank pain, decreased  urine volume, difficulty urinating and dyspareunia.  Musculoskeletal: Negative for back pain, joint swelling, arthralgia and gait problem. Neurological: Negative for dizziness, tremors, seizures, syncope, facial asymmetry, speech difficulty, weakness, light-headedness, numbness and headaches.  Hematological: Negative for adenopathy. Does not bruise/bleed easily. Psychiatric/Behavioral: Negative for hallucinations, behavioral problems, confusion, dysphoric mood, decreased concentration and agitation.   Past Medical, Surgical Family and Social History reviewed and updated.  Objective:   Today's Vitals   02/10/18 0835  BP: 122/78  Pulse: 82  Resp: 17  SpO2: 95%  Weight: 123 lb 3.2 oz (55.9 kg)  Height: 5\' 7"  (1.702 m)    Wt Readings from Last 3 Encounters:  02/10/18 123 lb 3.2 oz (55.9 kg)  06/28/17 126 lb 3.2 oz (57.2 kg)  05/13/17 136 lb (61.7 kg)    Physical Exam Constitutional: Patient appears well-developed and well-nourished. No distress. HENT: Normocephalic, atraumatic, External right and left ear normal. Oropharynx is clear and moist.  Eyes: Conjunctivae and EOM are normal. PERRLA, no scleral icterus. Neck: Normal ROM. Neck supple. No JVD. No tracheal deviation. No thyromegaly. CVS: RRR, S1/S2 +, no murmurs, no gallops, no carotid bruit.  Pulmonary: Effort and breath sounds normal, no stridor, rhonchi, wheezes, rales.  Abdominal: Soft. BS +, no distension, tenderness, rebound or guarding.  Musculoskeletal: Normal range of motion. No edema and no tenderness.  Neuro: Alert. Normal reflexes, muscle tone coordination. No cranial nerve deficit. Skin: Skin is warm and dry. No rash noted. Not diaphoretic. No erythema. No pallor. Psychiatric: Normal mood and affect. Behavior, judgment, thought content normal.  Assessment & Plan:  1. Encounter to establish care 2. Annual physical exam Age-appropriate anticipatory guidance provided  3. Screening for deficiency anemia - CBC with  Differential  4. Lipid screening - Lipid Panel  5. Diabetes mellitus screening - Comprehensive metabolic panel - Hemoglobin A1c  6. Thyroid disorder screen - TSH  7. Screening for blood or protein in urine - POCT URINALYSIS DIP (CLINITEK) - POCT urine pregnancy  8. Breastfeeding (infant) Specifically breast-feeding we will check a vitamin D level.  Encouraged increase intake of calcium with calcium supplements over-the-counter. - Vitamin D, 25-hydroxy     Return for care as needed and return in 1 year for CPE with Pap.   Joaquin CourtsKimberly Mohsen Odenthal, FNP [facility] [place] [address], [place] [id number] [phone]: [phone]

## 2018-02-11 LAB — CBC WITH DIFFERENTIAL/PLATELET
BASOS ABS: 0 10*3/uL (ref 0.0–0.2)
BASOS: 1 %
EOS (ABSOLUTE): 0.1 10*3/uL (ref 0.0–0.4)
Eos: 1 %
HEMATOCRIT: 36.8 % (ref 34.0–46.6)
HEMOGLOBIN: 12.1 g/dL (ref 11.1–15.9)
IMMATURE GRANS (ABS): 0 10*3/uL (ref 0.0–0.1)
Immature Granulocytes: 0 %
LYMPHS ABS: 1.6 10*3/uL (ref 0.7–3.1)
Lymphs: 44 %
MCH: 28.2 pg (ref 26.6–33.0)
MCHC: 32.9 g/dL (ref 31.5–35.7)
MCV: 86 fL (ref 79–97)
MONOCYTES: 12 %
Monocytes Absolute: 0.5 10*3/uL (ref 0.1–0.9)
NEUTROS ABS: 1.5 10*3/uL (ref 1.4–7.0)
Neutrophils: 42 %
Platelets: 386 10*3/uL (ref 150–450)
RBC: 4.29 x10E6/uL (ref 3.77–5.28)
RDW: 13 % (ref 11.7–15.4)
WBC: 3.6 10*3/uL (ref 3.4–10.8)

## 2018-02-11 LAB — COMPREHENSIVE METABOLIC PANEL
ALBUMIN: 4.3 g/dL (ref 3.9–5.0)
ALT: 9 IU/L (ref 0–32)
AST: 16 IU/L (ref 0–40)
Albumin/Globulin Ratio: 1.5 (ref 1.2–2.2)
Alkaline Phosphatase: 78 IU/L (ref 39–117)
BILIRUBIN TOTAL: 0.5 mg/dL (ref 0.0–1.2)
BUN/Creatinine Ratio: 21 (ref 9–23)
BUN: 10 mg/dL (ref 6–20)
CHLORIDE: 105 mmol/L (ref 96–106)
CO2: 22 mmol/L (ref 20–29)
Calcium: 9.2 mg/dL (ref 8.7–10.2)
Creatinine, Ser: 0.48 mg/dL — ABNORMAL LOW (ref 0.57–1.00)
GFR, EST AFRICAN AMERICAN: 154 mL/min/{1.73_m2} (ref >59)
GFR, EST NON AFRICAN AMERICAN: 134 mL/min/{1.73_m2} (ref >59)
GLUCOSE: 80 mg/dL (ref 65–99)
Globulin, Total: 2.8 g/dL (ref 1.5–4.5)
Potassium: 4.1 mmol/L (ref 3.5–5.2)
Sodium: 141 mmol/L (ref 134–144)
Total Protein: 7.1 g/dL (ref 6.0–8.5)

## 2018-02-11 LAB — LIPID PANEL
Chol/HDL Ratio: 2.4 ratio (ref 0.0–4.4)
Cholesterol, Total: 223 mg/dL — ABNORMAL HIGH (ref 100–199)
HDL: 93 mg/dL (ref >39)
LDL Calculated: 119 mg/dL — ABNORMAL HIGH (ref 0–99)
TRIGLYCERIDES: 54 mg/dL (ref 0–149)
VLDL CHOLESTEROL CAL: 11 mg/dL (ref 5–40)

## 2018-02-11 LAB — HEMOGLOBIN A1C
Est. average glucose Bld gHb Est-mCnc: 114 mg/dL
Hgb A1c MFr Bld: 5.6 % (ref 4.8–5.6)

## 2018-02-11 LAB — TSH: TSH: 1.85 u[IU]/mL (ref 0.450–4.500)

## 2018-02-11 LAB — VITAMIN D 25 HYDROXY (VIT D DEFICIENCY, FRACTURES): Vit D, 25-Hydroxy: 20.2 ng/mL — ABNORMAL LOW (ref 30.0–100.0)

## 2018-02-15 MED ORDER — VITAMIN D (ERGOCALCIFEROL) 1.25 MG (50000 UNIT) PO CAPS: 50000.0000 [IU] | ORAL_CAPSULE | ORAL | 0 refills | Status: DC

## 2018-02-15 NOTE — Addendum Note (Signed)
Addended by: Bing Neighbors on: 02/15/2018 05:38 PM   Modules accepted: Orders

## 2019-04-03 ENCOUNTER — Encounter: Payer: Self-pay | Admitting: Certified Nurse Midwife

## 2019-04-05 ENCOUNTER — Encounter: Payer: Self-pay | Admitting: Certified Nurse Midwife

## 2019-06-17 IMAGING — US US MFM OB COMP +14 WKS
1 series · 14 of 28 positions shown · non-contrast
Comparison: none

[Series 1: us mfm ob comp +14 wks · 73 acquisitions, 14 frames shown]
[im 3/73]
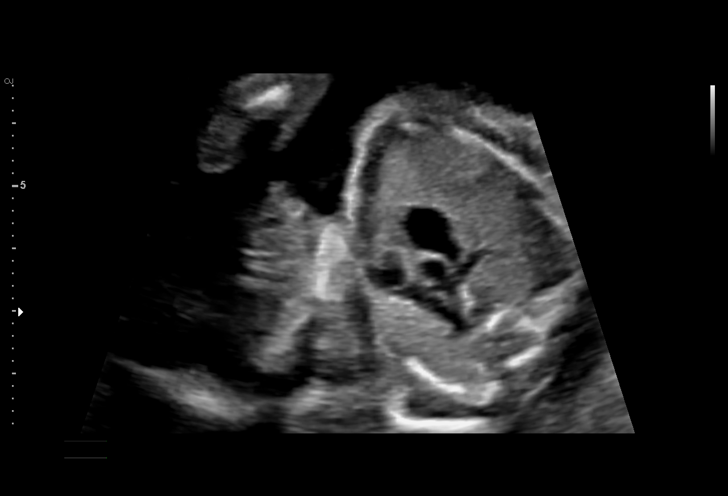
[im 9/73]
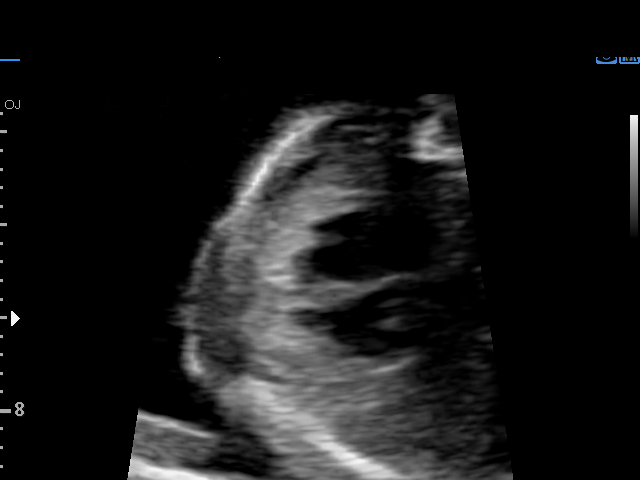
[im 14/73]
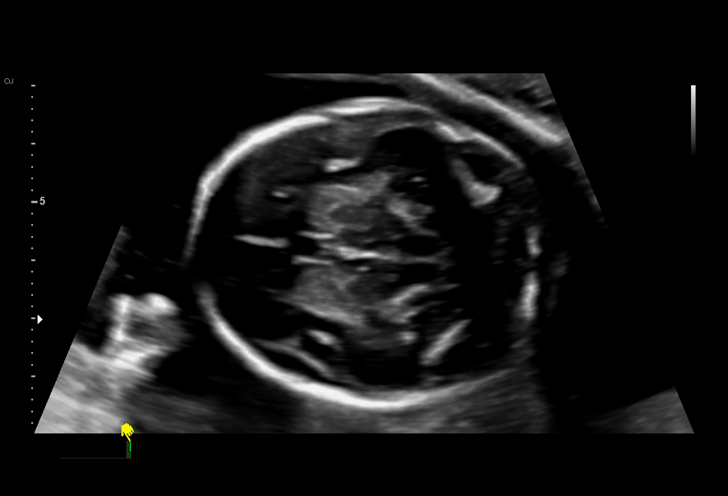
[im 19/73]
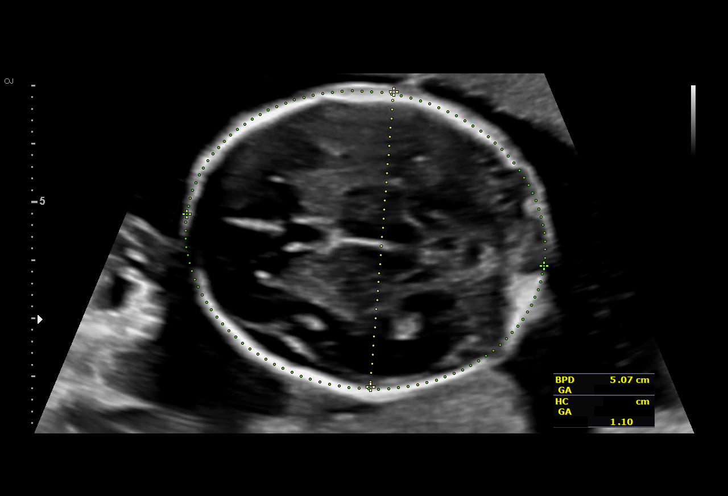
[im 25/73]
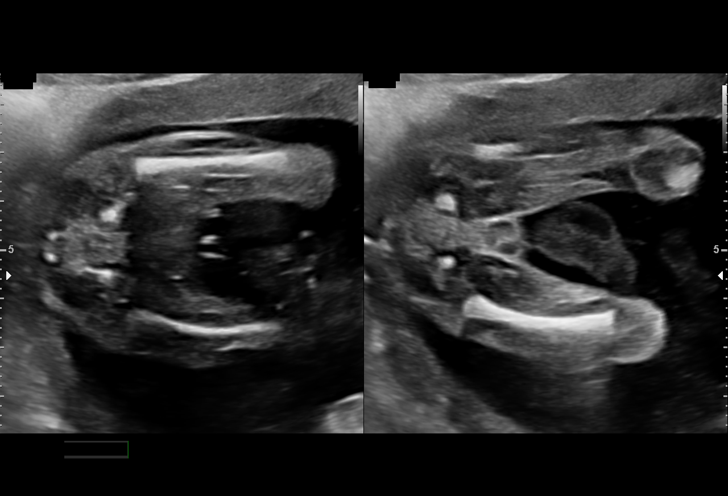
[im 30/73]
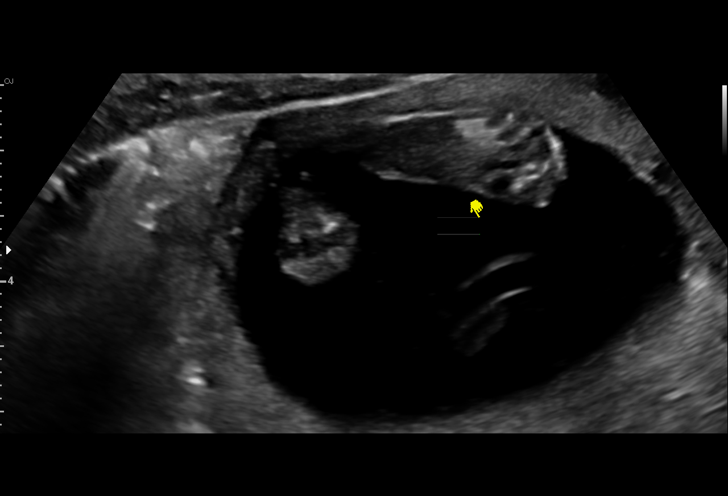
[im 35/73]
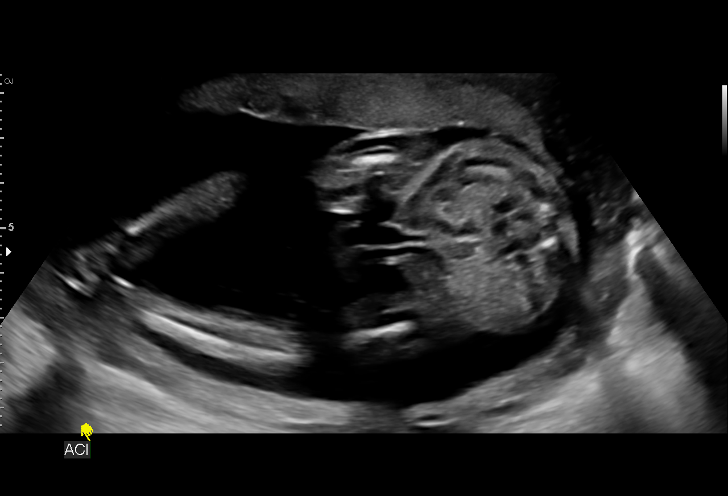
[im 41/73]
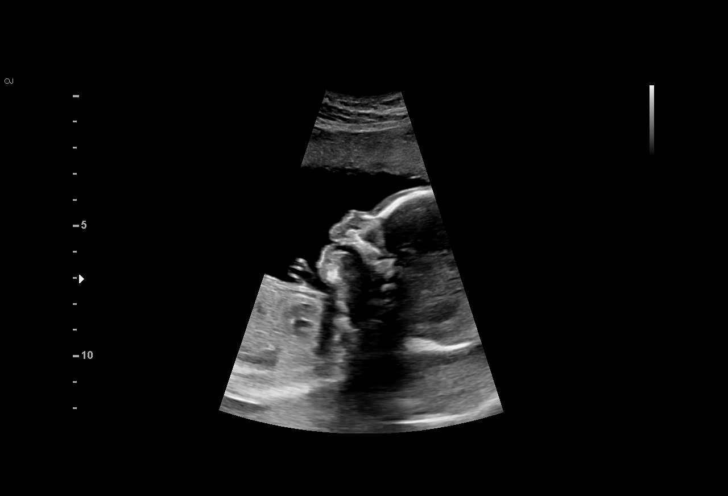
[im 46/73]
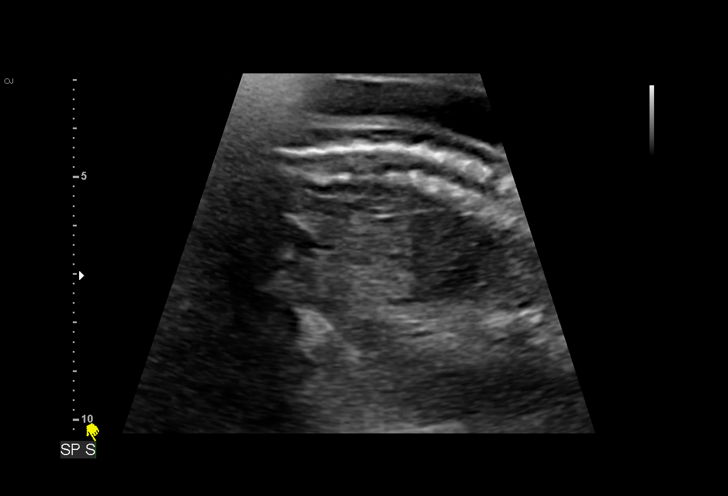
[im 51/73]
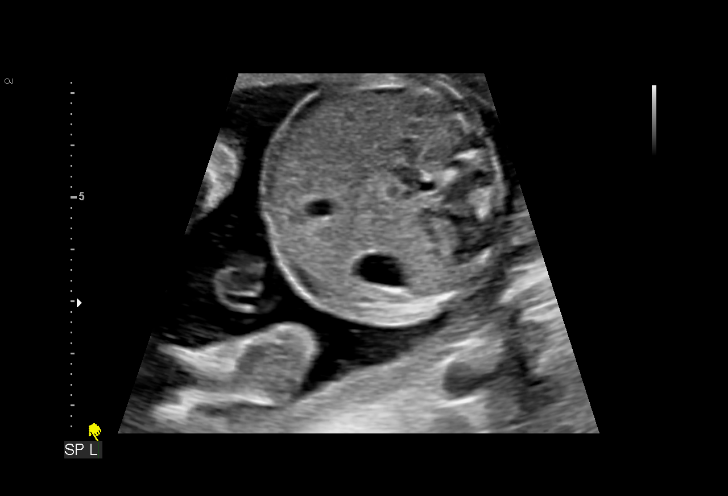
[im 57/73]
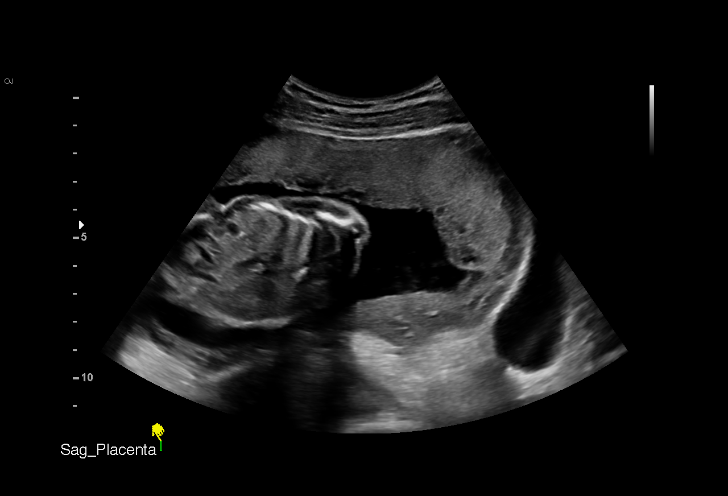
[im 62/73]
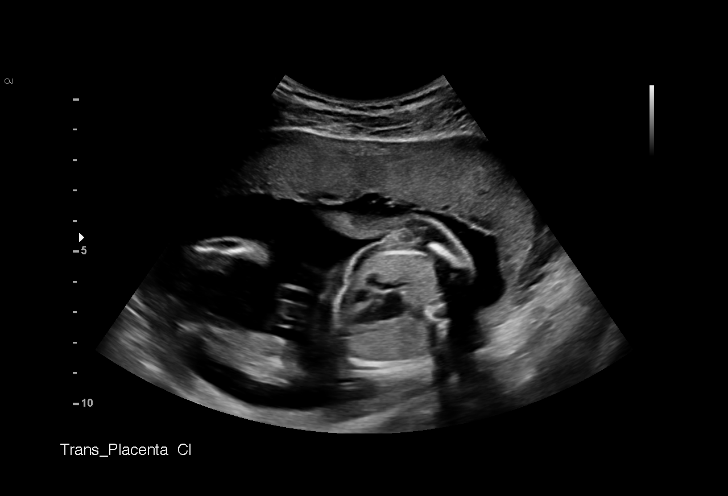
[im 67/73]
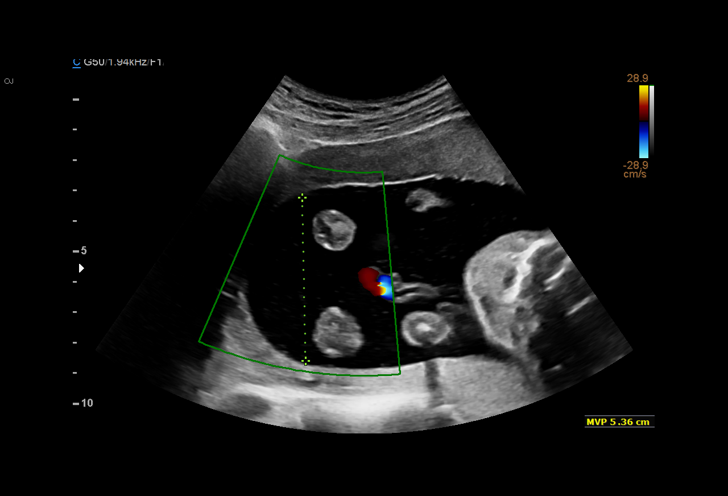
[im 73/73]
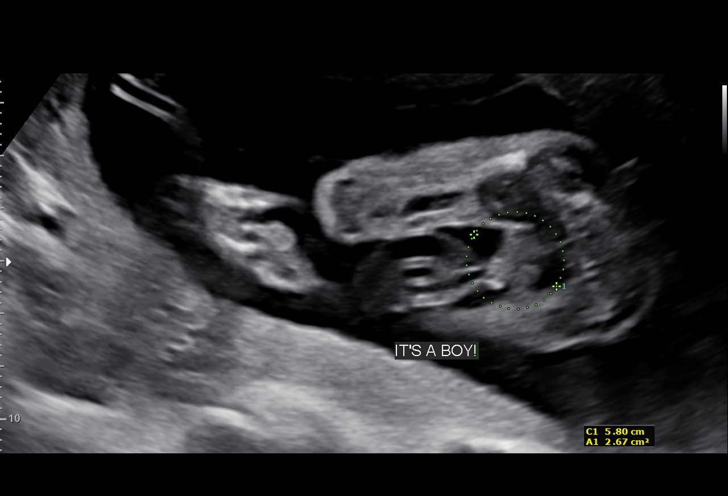

[14 of 28 positions shown; findings below may reference images not displayed]

1  FERIENHAUS ERXLEBEN              [id number]      [id number]     [id number]
Indications

19 weeks gestation of pregnancy
Encounter for fetal anatomic survey
OB History

Blood Type:            Height:  5'5"   Weight (lb):  126       BMI:
Gravidity:    1
Fetal Evaluation

Num Of Fetuses:     1
Fetal Heart         161
Rate(bpm):
Cardiac Activity:   Observed
Presentation:       Cephalic
Placenta:           Anterior, above cervical os
P. Cord Insertion:  Visualized

Amniotic Fluid
AFI FV:      Subjectively within normal limits

Largest Pocket(cm)
5.36
Biometry

BPD:      50.7  mm     G. Age:  21w 3d         96  %    CI:        81.14   %    70 - 86
FL/HC:       18.2  %    16.8 -
HC:      177.7  mm     G. Age:  20w 2d         67  %    HC/AC:       1.09       1.09 -
AC:      162.5  mm     G. Age:  21w 2d         89  %    FL/BPD:      63.9  %
FL:       32.4  mm     G. Age:  20w 1d         57  %    FL/AC:       19.9  %    20 - 24
HUM:      31.8  mm     G. Age:  20w 4d         77  %
CER:      22.1  mm     G. Age:  21w 0d         73  %
NFT:       5.5  mm
CM:        3.3  mm
Est. FW:     374   gm    0 lb 13 oz     62  %
Gestational Age

LMP:           19w 5d        Date:  08/19/16                 EDD:   05/26/17
U/S Today:     20w 6d                                        EDD:   05/18/17
Best:          19w 5d     Det. By:  LMP  (08/19/16)          EDD:   05/26/17
Anatomy

Cranium:               Appears normal         Aortic Arch:            Appears normal
Cavum:                 Appears normal         Ductal Arch:            Appears normal
Ventricles:            Appears normal         Diaphragm:              Appears normal
Choroid Plexus:        Appears normal         Stomach:                Appears normal, left
sided
Cerebellum:            Appears normal         Abdomen:                Appears normal
Posterior Fossa:       Appears normal         Abdominal Wall:         Appears nml (cord
insert, abd wall)
Nuchal Fold:           Appears normal         Cord Vessels:           Appears normal (3
vessel cord)
Face:                  Appears normal         Kidneys:                Appear normal
(orbits and profile)
Lips:                  Appears normal         Bladder:                Appears normal
Thoracic:              Appears normal         Spine:                  Appears normal
Heart:                 Appears normal         Upper Extremities:      Appears normal
(4CH, axis, and situs
RVOT:                  Appears normal         Lower Extremities:      Appears normal
LVOT:                  Appears normal

Other:  Male gender. Heels visualized.
Cervix Uterus Adnexa

Cervix
Length:            3.9  cm.
Normal appearance by transabdominal scan.

Uterus
No abnormality visualized.

Left Ovary
Not visualized.

Right Ovary
Not visualized.

Cul De Sac:   No free fluid seen.

Adnexa:       No abnormality visualized.
Impression

Singleton intrauterine pregnancy at 19+5 weeks
Review of the anatomy shows no sonographic markers for
aneuploidy or structural anomalies
Amniotic fluid volume is normal
Estimated fetal weight is 374g which is growth in the 62nd
percentile
Recommendations

Follow-up ultrasounds as clinically indicated.

## 2019-07-13 ENCOUNTER — Ambulatory Visit: Payer: Medicaid Other

## 2019-07-13 ENCOUNTER — Ambulatory Visit: Payer: Medicaid Other | Attending: Internal Medicine

## 2019-07-13 DIAGNOSIS — Z23 Encounter for immunization: Secondary | ICD-10-CM

## 2019-07-13 NOTE — Progress Notes (Signed)
   Covid-19 Vaccination Clinic  Name:  Judy Boyle    MRN: [id number] DOB: August 02, 1990  07/13/2019  Judy Boyle was observed post Covid-19 immunization for 15 minutes without incident. She was provided with Vaccine Information Sheet and instruction to access the V-Safe system.   Judy Boyle was instructed to call 911 with any severe reactions post vaccine: Marland Kitchen Difficulty breathing  . Swelling of face and throat  . A fast heartbeat  . A bad rash all over body  . Dizziness and weakness   Immunizations Administered    Name Date Dose VIS Date Route   Pfizer COVID-19 Vaccine 07/13/2019  8:27 AM 0.3 mL 03/15/2018 Intramuscular   Manufacturer: [employer], [employer]   Lot: [id number]   NDC: [address]-2

## 2019-08-03 ENCOUNTER — Ambulatory Visit: Payer: Medicaid Other

## 2019-08-17 ENCOUNTER — Ambulatory Visit: Payer: Medicaid Other | Attending: Internal Medicine

## 2019-08-17 DIAGNOSIS — Z23 Encounter for immunization: Secondary | ICD-10-CM

## 2019-08-17 NOTE — Progress Notes (Signed)
   Covid-19 Vaccination Clinic  Name:  Judy Boyle    MRN: [id number] DOB: April 02, 1990  08/17/2019  [name] was observed post Covid-19 immunization for 15 minutes without incident. She was provided with Vaccine Information Sheet and instruction to access the V-Safe system.   [name] was instructed to call 911 with any severe reactions post vaccine: Marland Kitchen Difficulty breathing  . Swelling of face and throat  . A fast heartbeat  . A bad rash all over body  . Dizziness and weakness   Immunizations Administered    Name Date Dose VIS Date Route   Pfizer COVID-19 Vaccine 08/17/2019 10:36 AM 0.3 mL 03/15/2018 Intramuscular   Manufacturer: [employer], [employer]   Lot: [id number]   NDC: [address]-9

## 2020-09-17 NOTE — Progress Notes (Signed)
Erroneous encounter

## 2020-09-19 ENCOUNTER — Encounter: Payer: Medicaid Other | Admitting: Family

## 2020-09-19 DIAGNOSIS — Z13228 Encounter for screening for other metabolic disorders: Secondary | ICD-10-CM

## 2020-09-19 DIAGNOSIS — Z13220 Encounter for screening for lipoid disorders: Secondary | ICD-10-CM

## 2020-09-19 DIAGNOSIS — Z0000 Encounter for general adult medical examination without abnormal findings: Secondary | ICD-10-CM

## 2020-09-19 DIAGNOSIS — Z113 Encounter for screening for infections with a predominantly sexual mode of transmission: Secondary | ICD-10-CM

## 2020-09-19 DIAGNOSIS — Z1329 Encounter for screening for other suspected endocrine disorder: Secondary | ICD-10-CM

## 2020-09-19 DIAGNOSIS — Z130 Encounter for screening for diseases of the blood and blood-forming organs and certain disorders involving the immune mechanism: Secondary | ICD-10-CM

## 2020-09-19 DIAGNOSIS — Z131 Encounter for screening for diabetes mellitus: Secondary | ICD-10-CM

## 2020-09-19 DIAGNOSIS — Z124 Encounter for screening for malignant neoplasm of cervix: Secondary | ICD-10-CM

## 2021-04-04 ENCOUNTER — Ambulatory Visit: Payer: Medicaid Other | Admitting: Nurse Practitioner

## 2022-05-26 ENCOUNTER — Other Ambulatory Visit: Payer: Medicaid Other

## 2022-05-26 ENCOUNTER — Ambulatory Visit: Payer: Medicaid Other | Admitting: Nurse Practitioner

## 2022-05-26 ENCOUNTER — Other Ambulatory Visit (HOSPITAL_COMMUNITY)
Admission: RE | Admit: 2022-05-26 | Discharge: 2022-05-26 | Disposition: A | Discharge status: Home / Self Care | Payer: Medicaid Other | Source: Ambulatory Visit | Attending: Nurse Practitioner | Admitting: Nurse Practitioner

## 2022-05-26 ENCOUNTER — Encounter: Payer: Self-pay | Admitting: Nurse Practitioner

## 2022-05-26 VITALS — BP 102/64 | Ht 66.5 in | Wt 118.0 lb

## 2022-05-26 DIAGNOSIS — Z113 Encounter for screening for infections with a predominantly sexual mode of transmission: Secondary | ICD-10-CM | POA: Diagnosis not present

## 2022-05-26 DIAGNOSIS — Z01419 Encounter for gynecological examination (general) (routine) without abnormal findings: Secondary | ICD-10-CM

## 2022-05-26 DIAGNOSIS — Z124 Encounter for screening for malignant neoplasm of cervix: Secondary | ICD-10-CM | POA: Diagnosis not present

## 2022-05-26 DIAGNOSIS — E785 Hyperlipidemia, unspecified: Secondary | ICD-10-CM

## 2022-05-26 DIAGNOSIS — E559 Vitamin D deficiency, unspecified: Secondary | ICD-10-CM

## 2022-05-26 LAB — CBC WITH DIFFERENTIAL/PLATELET
Absolute Monocytes: 498 cells/uL (ref 200–950)
Basophils Absolute: 61 cells/uL (ref 0–200)
Hemoglobin: 13.3 g/dL (ref 11.7–15.5)
Lymphs Abs: 1871 cells/uL (ref 850–3900)
MCV: 87.6 fL (ref 80.0–100.0)
RDW: 12.8 % (ref 11.0–15.0)
Total Lymphocyte: 39.8 %

## 2022-05-26 NOTE — Progress Notes (Signed)
   Judy Boyle 08/02/1990 [id number]   History:  32 y.o. G1P1001 presents as new patient to establish care. Monthly cycles. Still breastfeeding some. Not preventing pregnancy but not actively trying. Normal pap history. Would like STD screening today.   Gynecologic History Patient's last menstrual period was 05/19/2022 (exact date). Period Cycle (Days): 28 Period Duration (Days): 5 Period Pattern: Regular Menstrual Flow: Moderate Menstrual Control: Maxi pad Dysmenorrhea: (!) Moderate Dysmenorrhea Symptoms: Cramping Contraception/Family planning: none Sexually active: Yes  Health Maintenance Last Pap: 11/18/2016. Results were: Normal Last mammogram: Not indicated Last colonoscopy: Not indicated Last Dexa: Not indicated   Past medical history, past surgical history, family history and social history were all reviewed and documented in the EPIC chart. 28 yo son. Works as Social worker and in Nurse, mental health.   ROS:  A ROS was performed and pertinent positives and negatives are included.  Exam:  Vitals:   05/26/22 1314  BP: 102/64  Weight: 118 lb (53.5 kg)  Height: 5' 6.5" (1.689 m)   Body mass index is 18.76 kg/m.  General appearance:  Normal Thyroid:  Symmetrical, normal in size, without palpable masses or nodularity. Respiratory  Auscultation:  Clear without wheezing or rhonchi Cardiovascular  Auscultation:  Regular rate, without rubs, murmurs or gallops  Edema/varicosities:  Not grossly evident Abdominal  Soft,nontender, without masses, guarding or rebound.  Liver/spleen:  No organomegaly noted  Hernia:  None appreciated  Skin  Inspection:  Grossly normal Breasts: Examined lying and sitting.   Right: Without masses, retractions, nipple discharge or axillary adenopathy.   Left: Without masses, retractions, nipple discharge or axillary adenopathy. Genitourinary   Inguinal/mons:  Normal without inguinal adenopathy  External genitalia:  Normal appearing vulva with no  masses, tenderness, or lesions  BUS/Urethra/Skene's glands:  Normal  Vagina:  Normal appearing with normal color and discharge, no lesions  Cervix:  Normal appearing without discharge or lesions  Uterus:  Normal in size, shape and contour.  Midline and mobile, nontender  Adnexa/parametria:     Rt: Normal in size, without masses or tenderness.   Lt: Normal in size, without masses or tenderness.  Anus and perineum: Normal  Digital rectal exam: Deferred  Patient informed chaperone available to be present for breast and pelvic exam. Patient has requested no chaperone to be present. Patient has been advised what will be completed during breast and pelvic exam.   Assessment/Plan:  32 y.o. G1P1001 to establish care.   Well female exam with routine gynecological exam - Plan: CBC with Differential/Platelet, Comprehensive metabolic panel. Education provided on SBEs, importance of preventative screenings, current guidelines, high calcium diet, regular exercise, and multivitamin daily.   Screening for cervical cancer - Plan: Cytology - PAP( [place]). Normal pap history.   Hyperlipidemia, unspecified hyperlipidemia type - Plan: Lipid panel  Vitamin D deficiency - Plan: VITAMIN D 25 Hydroxy (Vit-D Deficiency, Fractures)  Screening examination for STD (sexually transmitted disease) - Plan: Cytology - PAP( [place]), RPR, HIV Antibody (routine testing w rflx). GC/CT/trich added to pap.   Return in 1 year for annual.     Olivia Mackie DNP, 1:46 PM 05/26/2022

## 2022-05-27 LAB — CYTOLOGY - PAP
Chlamydia: NEGATIVE
Comment: NEGATIVE
Comment: NEGATIVE
Comment: NEGATIVE
Comment: NORMAL
Diagnosis: NEGATIVE
High risk HPV: NEGATIVE
Neisseria Gonorrhea: NEGATIVE
Trichomonas: NEGATIVE

## 2022-05-27 LAB — COMPREHENSIVE METABOLIC PANEL
AG Ratio: 1.4 (calc) (ref 1.0–2.5)
ALT: 6 U/L (ref 6–29)
AST: 12 U/L (ref 10–30)
Albumin: 4.5 g/dL (ref 3.6–5.1)
Alkaline phosphatase (APISO): 46 U/L (ref 31–125)
BUN: 13 mg/dL (ref 7–25)
CO2: 24 mmol/L (ref 20–32)
Calcium: 9.5 mg/dL (ref 8.6–10.2)
Chloride: 102 mmol/L (ref 98–110)
Creat: 0.63 mg/dL (ref 0.50–0.97)
Globulin: 3.3 g/dL (calc) (ref 1.9–3.7)
Glucose, Bld: 74 mg/dL (ref 65–99)
Potassium: 4.4 mmol/L (ref 3.5–5.3)
Sodium: 137 mmol/L (ref 135–146)
Total Bilirubin: 0.9 mg/dL (ref 0.2–1.2)
Total Protein: 7.8 g/dL (ref 6.1–8.1)

## 2022-05-27 LAB — LIPID PANEL
Cholesterol: 228 mg/dL — ABNORMAL HIGH (ref <200)
HDL: 92 mg/dL (ref >=50)
LDL Cholesterol (Calc): 117 mg/dL (calc) — ABNORMAL HIGH
Non-HDL Cholesterol (Calc): 136 mg/dL (calc) — ABNORMAL HIGH (ref <130)
Total CHOL/HDL Ratio: 2.5 (calc) (ref <5.0)
Triglycerides: 90 mg/dL (ref <150)

## 2022-05-27 LAB — RPR: RPR Ser Ql: NONREACTIVE

## 2022-05-27 LAB — CBC WITH DIFFERENTIAL/PLATELET
Basophils Relative: 1.3 %
Eosinophils Absolute: 71 cells/uL (ref 15–500)
Eosinophils Relative: 1.5 %
HCT: 41.1 % (ref 35.0–45.0)
MCH: 28.4 pg (ref 27.0–33.0)
MCHC: 32.4 g/dL (ref 32.0–36.0)
MPV: 10.2 fL (ref 7.5–12.5)
Monocytes Relative: 10.6 %
Neutro Abs: 2200 cells/uL (ref 1500–7800)
Neutrophils Relative %: 46.8 %
Platelets: 397 10*3/uL (ref 140–400)
RBC: 4.69 10*6/uL (ref 3.80–5.10)
WBC: 4.7 10*3/uL (ref 3.8–10.8)

## 2022-05-27 LAB — HIV ANTIBODY (ROUTINE TESTING W REFLEX): HIV 1&2 Ab, 4th Generation: NONREACTIVE

## 2022-05-27 LAB — VITAMIN D 25 HYDROXY (VIT D DEFICIENCY, FRACTURES): Vit D, 25-Hydroxy: 25 ng/mL — ABNORMAL LOW (ref 30–100)

## 2022-07-08 ENCOUNTER — Ambulatory Visit: Payer: Medicaid Other | Admitting: Nurse Practitioner

## 2022-08-31 LAB — OB RESULTS CONSOLE GC/CHLAMYDIA
Chlamydia: NEGATIVE
Neisseria Gonorrhea: NEGATIVE

## 2022-08-31 LAB — OB RESULTS CONSOLE RPR: RPR: NONREACTIVE

## 2022-08-31 LAB — OB RESULTS CONSOLE HIV ANTIBODY (ROUTINE TESTING): HIV: NONREACTIVE

## 2022-08-31 LAB — OB RESULTS CONSOLE RUBELLA ANTIBODY, IGM: Rubella: IMMUNE

## 2022-08-31 LAB — OB RESULTS CONSOLE HEPATITIS B SURFACE ANTIGEN: Hepatitis B Surface Ag: NEGATIVE

## 2022-11-24 LAB — OB RESULTS CONSOLE HIV ANTIBODY (ROUTINE TESTING): HIV: NONREACTIVE

## 2022-11-24 LAB — OB RESULTS CONSOLE RPR: RPR: NONREACTIVE

## 2022-12-03 ENCOUNTER — Inpatient Hospital Stay (HOSPITAL_COMMUNITY): Payer: Medicaid Other

## 2022-12-03 ENCOUNTER — Encounter (HOSPITAL_COMMUNITY): Payer: Self-pay | Admitting: *Deleted

## 2022-12-03 ENCOUNTER — Inpatient Hospital Stay (HOSPITAL_COMMUNITY)
Admission: AD | Admit: 2022-12-03 | Discharge: 2022-12-03 | Disposition: A | Discharge status: Home / Self Care | Payer: Medicaid Other | Attending: Family Medicine | Admitting: Family Medicine

## 2022-12-03 DIAGNOSIS — Z3A29 29 weeks gestation of pregnancy: Secondary | ICD-10-CM | POA: Insufficient documentation

## 2022-12-03 DIAGNOSIS — R102 Pelvic and perineal pain: Secondary | ICD-10-CM | POA: Diagnosis present

## 2022-12-03 DIAGNOSIS — O368130 Decreased fetal movements, third trimester, not applicable or unspecified: Secondary | ICD-10-CM

## 2022-12-03 DIAGNOSIS — O368190 Decreased fetal movements, unspecified trimester, not applicable or unspecified: Secondary | ICD-10-CM

## 2022-12-03 LAB — URINALYSIS, ROUTINE W REFLEX MICROSCOPIC
Bilirubin Urine: NEGATIVE
Glucose, UA: NEGATIVE mg/dL
Hgb urine dipstick: NEGATIVE
Ketones, ur: NEGATIVE mg/dL
Nitrite: NEGATIVE
Protein, ur: NEGATIVE mg/dL
Specific Gravity, Urine: 1.02 (ref 1.005–1.030)
pH: 6 (ref 5.0–8.0)

## 2022-12-03 NOTE — MAU Provider Note (Signed)
History     CSN: [id number]  Arrival date and time: 12/03/22 1856   None     Chief Complaint  Patient presents with   Decreased Fetal Movement   HPI Patient presenting for evaluation due to decreased fetal movement.  Reports that over the past 2 days she has noticed much less movement in her fetus.  Reports that this morning she may have felt 2 movements in an hour and does not think she reached 10 movements in 2 hours.  Denies any bleeding, gushing of fluid, contractions.  Does acknowledge that she had several shooting pains in her pubic area today that resolved spontaneously.  No other concerns.  OB History     Gravida  2   Para  1   Term  1   Preterm      AB      Living  1      SAB      IAB      Ectopic      Multiple  0   Live Births  1           History reviewed. No pertinent past medical history.  Past Surgical History:  Procedure Laterality Date   WISDOM TOOTH EXTRACTION  6/14    Family History  Problem Relation Age of Onset   Diabetes Maternal Uncle    Breast cancer Maternal Grandmother     Social History   Tobacco Use   Smoking status: Never    Passive exposure: Never   Smokeless tobacco: Never  Vaping Use   Vaping status: Never Used  Substance Use Topics   Alcohol use: Not Currently    Alcohol/week: 2.0 standard drinks of alcohol    Types: 2 Standard drinks or equivalent per week    Comment: occasionally   Drug use: No    Allergies: No Known Allergies  No medications prior to admission.    Review of Systems  Constitutional:  Negative for activity change, chills and fatigue.  HENT:  Negative for congestion and rhinorrhea.   Eyes:  Negative for visual disturbance.  Cardiovascular:  Negative for chest pain.  Gastrointestinal:  Negative for abdominal pain.  Endocrine: Negative for polyuria.  Genitourinary:  Positive for vaginal pain. Negative for dyspareunia.  Neurological:  Negative for headaches.  All other systems  reviewed and are negative.  Physical Exam   Blood pressure 110/71, pulse 84, temperature (!) 97.3 F (36.3 C), resp. rate 17, height 5' 6.5" (1.689 m), weight 63 kg, last menstrual period 05/19/2022, SpO2 100%, currently breastfeeding.  Physical Exam Vitals reviewed.  Constitutional:      Appearance: Normal appearance.  HENT:     Head: Normocephalic and atraumatic.     Nose: Nose normal.     Mouth/Throat:     Mouth: Mucous membranes are moist.  Eyes:     Extraocular Movements: Extraocular movements intact.     Pupils: Pupils are equal, round, and reactive to light.  Cardiovascular:     Rate and Rhythm: Normal rate.     Pulses: Normal pulses.  Pulmonary:     Effort: Pulmonary effort is normal.  Abdominal:     General: Abdomen is flat.     Palpations: Abdomen is soft.  Musculoskeletal:     Cervical back: Normal range of motion.  Skin:    General: Skin is warm.     Capillary Refill: Capillary refill takes less than 2 seconds.  Neurological:     General: No focal deficit present.  Mental Status: She is alert.  Psychiatric:        Mood and Affect: Mood normal.    MAU Course  Procedures  MDM BPP  Assessment and Plan  Judy Boyle presenting for decreased fetal movement  Decreased fetal movement Worsening decreased fetal movement over the last 2 days.  Did not feel 5 movements an hour 10 and 2 hours.  BPP today 8/8.  Discussed kick counts and patient reassured.  Patient discharged home with return precautions.  No further questions or concerns.  Judy Boyle 12/03/2022, 8:17 PM

## 2022-12-03 NOTE — MAU Note (Signed)
.  Judy Boyle is a 32 y.o. at [redacted]w[redacted]d here in MAU reporting decreased FM for the last 2 days. Denies VB or LOF. Some shooting, sharp pain in vagina that feels like she may "pee in my pants." No pain currently.  Onset of complaint: 2days Pain score: 0 Vitals:   12/03/22 1911 12/03/22 1915  BP:  111/66  Pulse: 74   Resp: 17   Temp: (!) 97.3 F (36.3 C)   SpO2: 100%      FHT:150 Lab orders placed from triage:  u/a

## 2023-01-20 NOTE — L&D Delivery Note (Signed)
OB/GYN [facility] Delivery Note  Judy Boyle is a 33 y.o. G2P1001 s/p VD at [redacted]w[redacted]d. She was admitted for SOL.   ROM: 0h 51m with clear fluid GBS Status: Negative   Maximum Maternal Temperature: 98.0  Labor Progress: Initial SVE: 5/70/-1. She then progressed to complete.   Delivery Date/Time: 02/15/23 @0502  Delivery: Called to room and patient was complete and pushing. Head delivered LOA. Loose nuchal cord present x1 with a compound hand. Shoulder and body delivered in usual fashion. Infant with spontaneous cry, placed on mother's abdomen, dried and stimulated. Cord clamped x 2 after 1-minute delay, and cut by FOB. Cord blood drawn. Placenta delivered spontaneously with gentle cord traction. Fundus firm with massage and Pitocin. Labia, perineum, vagina, and cervix inspected. There was a large 1st degree perineal laceration that would benefit from approximation. It was repaired with good hemostasis.  Baby Weight: pending  Placenta: 3 vessel, intact. Sent to L&D Complications: None Lacerations: None EBL: 305 mL Analgesia: Maternally supported for delivery. IV fentanyl and lidocaine for perineal repair.   Infant:  APGAR (1 MIN): 8  APGAR (5 MINS): 9   Judy Fiorenza Autry-Lott, DO 02/15/2023, 5:38 AM

## 2023-01-21 LAB — OB RESULTS CONSOLE GBS: GBS: NEGATIVE

## 2023-02-15 ENCOUNTER — Other Ambulatory Visit: Payer: Self-pay

## 2023-02-15 ENCOUNTER — Inpatient Hospital Stay (HOSPITAL_COMMUNITY)
Admission: AD | Admit: 2023-02-15 | Discharge: 2023-02-17 | DRG: 806 | Disposition: A | Discharge status: Home / Self Care | Payer: Medicaid Other | Attending: Obstetrics and Gynecology | Admitting: Obstetrics and Gynecology

## 2023-02-15 ENCOUNTER — Encounter (HOSPITAL_COMMUNITY): Payer: Self-pay | Admitting: Obstetrics and Gynecology

## 2023-02-15 DIAGNOSIS — Z3A39 39 weeks gestation of pregnancy: Secondary | ICD-10-CM | POA: Diagnosis not present

## 2023-02-15 DIAGNOSIS — O26893 Other specified pregnancy related conditions, third trimester: Secondary | ICD-10-CM | POA: Diagnosis present

## 2023-02-15 DIAGNOSIS — O9832 Other infections with a predominantly sexual mode of transmission complicating childbirth: Secondary | ICD-10-CM | POA: Diagnosis present

## 2023-02-15 DIAGNOSIS — A6000 Herpesviral infection of urogenital system, unspecified: Secondary | ICD-10-CM | POA: Diagnosis present

## 2023-02-15 DIAGNOSIS — Z833 Family history of diabetes mellitus: Secondary | ICD-10-CM | POA: Diagnosis not present

## 2023-02-15 LAB — CBC
HCT: 31.5 % — ABNORMAL LOW (ref 36.0–46.0)
Hemoglobin: 10.1 g/dL — ABNORMAL LOW (ref 12.0–15.0)
MCH: 27.2 pg (ref 26.0–34.0)
MCHC: 32.1 g/dL (ref 30.0–36.0)
MCV: 84.7 fL (ref 80.0–100.0)
Platelets: 277 10*3/uL (ref 150–400)
RBC: 3.72 MIL/uL — ABNORMAL LOW (ref 3.87–5.11)
RDW: 13.6 % (ref 11.5–15.5)
WBC: 8.1 10*3/uL (ref 4.0–10.5)
nRBC: 0 % (ref 0.0–0.2)

## 2023-02-15 LAB — TYPE AND SCREEN
ABO/RH(D): A POS
Antibody Screen: NEGATIVE

## 2023-02-15 LAB — RPR: RPR Ser Ql: NONREACTIVE

## 2023-02-15 MED ORDER — TETANUS-DIPHTH-ACELL PERTUSSIS 5-2.5-18.5 LF-MCG/0.5 IM SUSY: 0.5000 mL | PREFILLED_SYRINGE | Freq: Once | INTRAMUSCULAR | Status: DC

## 2023-02-15 MED ORDER — BENZOCAINE-MENTHOL 20-0.5 % EX AERO
1.0000 | INHALATION_SPRAY | CUTANEOUS | Status: DC | PRN
  Administered 2023-02-15: 1 via TOPICAL
  Filled 2023-02-15: qty 56

## 2023-02-15 MED ORDER — LIDOCAINE HCL (PF) 1 % IJ SOLN
30.0000 mL | INTRAMUSCULAR | Status: AC | PRN
  Administered 2023-02-15: 30 mL via SUBCUTANEOUS
  Filled 2023-02-15: qty 30

## 2023-02-15 MED ORDER — LACTATED RINGERS IV SOLN: 500.0000 mL | INTRAVENOUS | Status: DC | PRN

## 2023-02-15 MED ORDER — PRENATAL MULTIVITAMIN CH
1.0000 | ORAL_TABLET | Freq: Every day | ORAL | Status: DC
  Filled 2023-02-15 (×3): qty 1

## 2023-02-15 MED ORDER — ONDANSETRON HCL 4 MG/2ML IJ SOLN: 4.0000 mg | INTRAMUSCULAR | Status: DC | PRN

## 2023-02-15 MED ORDER — SENNOSIDES-DOCUSATE SODIUM 8.6-50 MG PO TABS
2.0000 | ORAL_TABLET | Freq: Every day | ORAL | Status: DC
  Administered 2023-02-16: 2 via ORAL
  Filled 2023-02-15 (×2): qty 2

## 2023-02-15 MED ORDER — ACETAMINOPHEN 325 MG PO TABS
650.0000 mg | ORAL_TABLET | ORAL | Status: DC | PRN
  Administered 2023-02-15: 650 mg via ORAL
  Filled 2023-02-15: qty 2

## 2023-02-15 MED ORDER — IBUPROFEN 600 MG PO TABS
600.0000 mg | ORAL_TABLET | Freq: Four times a day (QID) | ORAL | Status: DC
  Administered 2023-02-15 – 2023-02-17 (×7): 600 mg via ORAL
  Filled 2023-02-15 (×8): qty 1

## 2023-02-15 MED ORDER — COCONUT OIL OIL: 1.0000 | TOPICAL_OIL | Status: DC | PRN

## 2023-02-15 MED ORDER — ZOLPIDEM TARTRATE 5 MG PO TABS: 5.0000 mg | ORAL_TABLET | Freq: Every evening | ORAL | Status: DC | PRN

## 2023-02-15 MED ORDER — ONDANSETRON HCL 4 MG PO TABS: 4.0000 mg | ORAL_TABLET | ORAL | Status: DC | PRN

## 2023-02-15 MED ORDER — FENTANYL CITRATE (PF) 100 MCG/2ML IJ SOLN
INTRAMUSCULAR | Status: AC
  Administered 2023-02-15: 100 ug
  Filled 2023-02-15: qty 2

## 2023-02-15 MED ORDER — ONDANSETRON HCL 4 MG/2ML IJ SOLN: 4.0000 mg | Freq: Four times a day (QID) | INTRAMUSCULAR | Status: DC | PRN

## 2023-02-15 MED ORDER — SOD CITRATE-CITRIC ACID 500-334 MG/5ML PO SOLN: 30.0000 mL | ORAL | Status: DC | PRN

## 2023-02-15 MED ORDER — WITCH HAZEL-GLYCERIN EX PADS: 1.0000 | MEDICATED_PAD | CUTANEOUS | Status: DC | PRN

## 2023-02-15 MED ORDER — OXYTOCIN BOLUS FROM INFUSION
333.0000 mL | Freq: Once | INTRAVENOUS | Status: AC
  Administered 2023-02-15: 333 mL via INTRAVENOUS

## 2023-02-15 MED ORDER — DIPHENHYDRAMINE HCL 25 MG PO CAPS: 25.0000 mg | ORAL_CAPSULE | Freq: Four times a day (QID) | ORAL | Status: DC | PRN

## 2023-02-15 MED ORDER — OXYTOCIN-SODIUM CHLORIDE 30-0.9 UT/500ML-% IV SOLN
2.5000 [IU]/h | INTRAVENOUS | Status: DC
  Administered 2023-02-15: 2.5 [IU]/h via INTRAVENOUS
  Filled 2023-02-15: qty 500

## 2023-02-15 MED ORDER — SIMETHICONE 80 MG PO CHEW: 80.0000 mg | CHEWABLE_TABLET | ORAL | Status: DC | PRN

## 2023-02-15 MED ORDER — LACTATED RINGERS IV SOLN: INTRAVENOUS | Status: DC

## 2023-02-15 MED ORDER — DIBUCAINE (PERIANAL) 1 % EX OINT: 1.0000 | TOPICAL_OINTMENT | CUTANEOUS | Status: DC | PRN

## 2023-02-15 MED ORDER — ACETAMINOPHEN 325 MG PO TABS: 650.0000 mg | ORAL_TABLET | ORAL | Status: DC | PRN

## 2023-02-15 NOTE — Lactation Note (Signed)
This note was copied from a baby's chart. Lactation Consultation Note  Patient Name: Judy Boyle [name] Date: 02/15/2023 Age:34 hours Reason for consult: Initial assessment P2 , experienced BF x 4 years.  Baby STS with mom and per mom recently attempted to latch and baby not interested.  LC recommended to feed with cues and call for Latch assessment when baby is acting hungry. Per mom has not wet and had a stool in L/D.    Maternal Data Does the patient have breastfeeding experience prior to this delivery?: Yes How long did the patient breastfeed?: per mom 4 years  Feeding Mother's Current Feeding Choice: Breast Milk  LATCH Score ( Latch score from L/D by the [facility] )  Latch: Too sleepy or reluctant, no latch achieved, no sucking elicited.  Audible Swallowing: None  Type of Nipple: Everted at rest and after stimulation  Comfort (Breast/Nipple): Soft / non-tender  Hold (Positioning): Assistance needed to correctly position infant at breast and maintain latch.  LATCH Score: 5   Lactation Tools Discussed/Used Pump Education: Milk Storage  Interventions Interventions: Breast feeding basics reviewed;Education;LC Services brochure;CDC Guidelines for Breast Pump Cleaning  Discharge Pump: Hands Free;Personal  Consult Status Consult Status: Follow-up Date: 02/15/23 Follow-up type: In-patient    [name] 02/15/2023, 8:50 AM

## 2023-02-15 NOTE — MAU Note (Signed)
.  Judy Boyle is a 33 y.o. at [redacted]w[redacted]d here in MAU reporting: ctx since 2145 every 2-3 minutes. Denies VB or LOF. +FM   Onset of complaint: 2145 Pain score: 7 Vitals:   02/15/23 0141  BP: 129/82  Pulse: 92  Resp: 20  Temp: 98 F (36.7 C)  SpO2: 100%     FHT: 133  Lab orders placed from triage: labor eval

## 2023-02-15 NOTE — H&P (Signed)
OBSTETRIC ADMISSION HISTORY AND PHYSICAL  Artice Laible is a 33 y.o. female G2P1001 with IUP at [redacted]w[redacted]d by 2nd trimester [place] presenting for SOL. CTX started 2145 occurring every 2-3 mins. She reports +FMs, No LOF, no VB, no blurry vision, headaches or peripheral edema, and RUQ pain.  She received her prenatal care at  [facility]    Dating: By 2nd trimester [place] --->  Estimated Date of Delivery: 02/16/23  Sono:    @[redacted]w[redacted]d , CWD, limited anatomy views normal on follow up, breech presentation, anterior placental, 39% EFW   Prenatal History/Complications:  HSV-2 Late to [facility] @16wks   Past Medical History: History reviewed. No pertinent past medical history.  Past Surgical History: Past Surgical History:  Procedure Laterality Date   WISDOM TOOTH EXTRACTION  6/14    Obstetrical History: OB History     Gravida  2   Para  1   Term  1   Preterm      AB      Living  1      SAB      IAB      Ectopic      Multiple  0   Live Births  1           Social History Social History   Socioeconomic History   Marital status: Single    Spouse name: Not on file   Number of children: Not on file   Years of education: Not on file   Highest education level: Not on file  Occupational History   Not on file  Tobacco Use   Smoking status: Never    Passive exposure: Never   Smokeless tobacco: Never  Vaping Use   Vaping status: Never Used  Substance and Sexual Activity   Alcohol use: Not Currently    Alcohol/week: 2.0 standard drinks of alcohol    Types: 2 Standard drinks or equivalent per week    Comment: occasionally   Drug use: No   Sexual activity: Not Currently    Partners: Male    Birth control/protection: None    Comment: menarche 33yo, 33yo  Other Topics Concern   Not on file  Social History Narrative   Not on file   Social Drivers of Health   Financial Resource Strain: Not on file  Food Insecurity: Not on file  Transportation Needs: Not on file  Physical  Activity: Not on file  Stress: Not on file  Social Connections: Not on file    Family History: Family History  Problem Relation Age of Onset   Diabetes Maternal Uncle    Breast cancer Maternal Grandmother     Allergies: No Known Allergies  No medications prior to admission.     Review of Systems   All systems reviewed and negative except as stated in HPI  Blood pressure 129/82, pulse 92, temperature 98 F (36.7 C), temperature source Oral, resp. rate 20, height 5\' 6"  (1.676 m), weight 65.4 kg, last menstrual period 05/19/2022, SpO2 100%, currently breastfeeding. General appearance: alert and no distress Lungs: increased effort during contractions Heart: regular rate noted Abdomen: appropriately gravid for gestational age Extremities: No LE edema Presentation: cephalic Fetal monitoringBaseline: 130 bpm, Variability: Good {> 6 bpm), Accelerations: Reactive, and Decelerations: Variable: mild Uterine activity every 1-4 mins Dilation: 5 Effacement (%): 70 Station: -1 Exam by:: Henrine Screws, RN   Prenatal labs: ABO, Rh:  A+ Antibody:  Negative Rubella:  Immune RPR: NON-REACTIVE (05/07 1350)  HBsAg:  Negative HIV: NON-REACTIVE (05/07 1350)  GBS:      Lab Results  Component Value Date   GBS Negative 05/13/2017   GTT 125 Genetic screening  declined Anatomy US limited views of the spine, normal at follow up  Immunization History  Administered Date(s) Administered   PFIZER(Purple Top)SARS-COV-2 Vaccination 07/13/2019, 08/17/2019   Tdap 01/20/2007, 03/29/2017    Prenatal Transfer Tool  Maternal Diabetes: No Genetic Screening: Declined Maternal Ultrasounds/Referrals: Normal Fetal Ultrasounds or other Referrals:  None Maternal Substance Abuse:  No Significant Maternal Medications:  None Significant Maternal Lab Results: Group B Strep negative Number of Prenatal Visits:greater than 3 verified prenatal visits Maternal Vaccinations:Declined Other Comments:    Late to care at 16 weeks   No results found for this or any previous visit (from the past 24 hours).  Patient Active Problem List   Diagnosis Date Noted   History of ELISA positive for HSV 12/14/2016    Assessment/Plan:  Doninique Lwin is a 33 y.o. G2P1001 at [redacted]w[redacted]d here for SOL.  #Labor: Expectant management for now. Consider AROM when appropriate.  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

## 2023-02-15 NOTE — Lactation Note (Signed)
This note was copied from a baby's chart. Lactation Consultation Note  Patient Name: Judy Boyle [id number] Date: 02/15/2023 Age:34 hours Reason for consult: L&D Initial assessment;Term Attempted to get baby to latch. Baby not interested. Wouldn't open mouth. Mom holding baby STS.   Maternal Data    Feeding    LATCH Score Latch: Too sleepy or reluctant, no latch achieved, no sucking elicited.  Audible Swallowing: None  Type of Nipple: Everted at rest and after stimulation  Comfort (Breast/Nipple): Soft / non-tender  Hold (Positioning): Assistance needed to correctly position infant at breast and maintain latch.  LATCH Score: 5   Lactation Tools Discussed/Used    Interventions Interventions: Assisted with latch;Skin to skin;Adjust position  Discharge    Consult Status Consult Status: Follow-up from L&D Date: 02/15/23 Follow-up type: In-patient    Charyl Dancer 02/15/2023, 5:57 AM

## 2023-02-16 LAB — CBC
HCT: 26 % — ABNORMAL LOW (ref 36.0–46.0)
Hemoglobin: 8.6 g/dL — ABNORMAL LOW (ref 12.0–15.0)
MCH: 27.4 pg (ref 26.0–34.0)
MCHC: 33.1 g/dL (ref 30.0–36.0)
MCV: 82.8 fL (ref 80.0–100.0)
Platelets: 268 10*3/uL (ref 150–400)
RBC: 3.14 MIL/uL — ABNORMAL LOW (ref 3.87–5.11)
RDW: 13.5 % (ref 11.5–15.5)
WBC: 9.6 10*3/uL (ref 4.0–10.5)
nRBC: 0 % (ref 0.0–0.2)

## 2023-02-16 LAB — BIRTH TISSUE RECOVERY COLLECTION (PLACENTA DONATION)

## 2023-02-16 MED ORDER — IBUPROFEN 600 MG PO TABS: 600.0000 mg | ORAL_TABLET | Freq: Four times a day (QID) | ORAL | 0 refills | Status: AC

## 2023-02-16 MED ORDER — ACETAMINOPHEN 325 MG PO TABS: 650.0000 mg | ORAL_TABLET | ORAL | 1 refills | Status: AC | PRN

## 2023-02-16 MED ORDER — BENZOCAINE-MENTHOL 20-0.5 % EX AERO: 1.0000 | INHALATION_SPRAY | CUTANEOUS | 1 refills | Status: AC | PRN

## 2023-02-16 NOTE — Lactation Note (Signed)
This note was copied from a baby's chart. Lactation Consultation Note  Patient Name: Judy Boyle [id number] Date: 02/16/2023 Age:34 hours Reason for consult: Follow-up assessment;Term  P2, 39 wks, @ 30 hrs of life. Time for feeding, mom receptive to latching with LC. Demonstrated holds, steps of latching. Encouraged mom to start with hand expression, milk is running down her nipple, and spraying across bed. 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 Encouraged mom baby is spacing out feeds/ spitting up because milk is MORE than expected. Encouraged hand expression and breast compression, baby actively feeds 5 minutes- passes out content post feed. Discussed expected weight loss, baby on high side. Discussed sleepy first day, but more awake/ feeding cues today and cluster feeding overnight to bring milk in. Encouraged baby might be sleepy post circumcision- can hand express onto spoon and feed off spoon. Demonstrated with mom- easily collected spoonful of milk within a minute and finished feed by giving to baby.   Maternal Data Has patient been taught Hand Expression?: Yes Does the patient have breastfeeding experience prior to this delivery?: Yes How long did the patient breastfeed?: Exp BF, several years, still feeds preschooler @ times  Feeding Mother's Current Feeding Choice: Breast Milk  LATCH Score Latch: Grasps breast easily, tongue down, lips flanged, rhythmical sucking.  Audible Swallowing: Spontaneous and intermittent  Type of Nipple: Everted at rest and after stimulation  Comfort (Breast/Nipple): Soft / non-tender  Hold (Positioning): Assistance needed to correctly position infant at breast and maintain latch.  LATCH Score: 9   Lactation Tools Discussed/Used    Interventions Interventions: Breast feeding basics reviewed;Assisted with latch;Hand express;Breast compression;Expressed milk;Education  Discharge Discharge Education:  Engorgement and breast care Pump: Personal;Hands Free  Consult Status Consult Status: Follow-up Date: 02/17/23 Follow-up type: In-patient    [facility] 02/16/2023, 11:40 AM

## 2023-02-16 NOTE — Discharge Summary (Signed)
Postpartum Discharge Summary  Date of Service updated1/28/25     Patient Name: Judy Boyle DOB: 1990-09-20 MRN: [id number]  Date of admission: 02/15/2023 Delivery date:02/15/2023 Delivering provider: Lavonda Jumbo Date of discharge: 02/16/2023  Admitting diagnosis: Normal labor [O80, Z37.9] Intrauterine pregnancy: [redacted]w[redacted]d     Secondary diagnosis:  Principal Problem:   Normal labor  Additional problems: infant for circ in office    Discharge diagnosis: Term Pregnancy Delivered                                              Post partum procedures:   Augmentation: N/A Complications: None  Hospital course: Onset of Labor With Vaginal Delivery      33 y.o. yo [id number] at [redacted]w[redacted]d was admitted in Active Labor on 02/15/2023. Labor course was complicated bynothing  Membrane Rupture Time/Date: 4:29 AM,02/15/2023  Delivery Method:Vaginal, Spontaneous Operative Delivery:N/A Episiotomy: None Lacerations:  1st degree;Perineal Patient had a postpartum course complicated by .  She is ambulating, tolerating a regular diet, passing flatus, and urinating well. Patient is discharged home in stable condition on 02/16/23.  Newborn Data: Birth date:02/15/2023 Birth time:5:02 AM Gender:Female Living status:Living Apgars:8 ,9  Weight:2960 g  Magnesium Sulfate received: No BMZ received: No Rhophylac:No MMR:No T-DaP:Given postpartum Flu: No RSV Vaccine received: No Transfusion:No Immunizations administered: Immunization History  Administered Date(s) Administered   PFIZER(Purple Top)SARS-COV-2 Vaccination 07/13/2019, 08/17/2019   Tdap 01/20/2007, 03/29/2017    Physical exam  Vitals:   02/15/23 1737 02/15/23 2105 02/16/23 0601 02/16/23 1412  BP: 130/84 109/72 113/77 112/83  Pulse:  87 78 78  Resp: 17 18 18 18   Temp: (!) 97.4 F (36.3 C) 97.8 F (36.6 C)  98.6 F (37 C)  TempSrc: Oral Oral  Oral  SpO2: 99% 100% 97%   Weight:      Height:       General: alert and  cooperative Lochia: appropriate Uterine Fundus: firm Incision: N/A DVT Evaluation: No evidence of DVT seen on physical exam. Labs: Lab Results  Component Value Date   WBC 9.6 02/16/2023   HGB 8.6 (L) 02/16/2023   HCT 26.0 (L) 02/16/2023   MCV 82.8 02/16/2023   PLT 268 02/16/2023      Latest Ref Rng & Units 05/26/2022    1:50 PM  CMP  Glucose 65 - 99 mg/dL 74   BUN 7 - 25 mg/dL 13   Creatinine 1.47 - 0.97 mg/dL 8.29   Sodium 562 - 130 mmol/L 137   Potassium 3.5 - 5.3 mmol/L 4.4   Chloride 98 - 110 mmol/L 102   CO2 20 - 32 mmol/L 24   Calcium 8.6 - 10.2 mg/dL 9.5   Total Protein 6.1 - 8.1 g/dL 7.8   Total Bilirubin 0.2 - 1.2 mg/dL 0.9   AST 10 - 30 U/L 12   ALT 6 - 29 U/L 6    Edinburgh Score:    02/16/2023    8:38 AM  Edinburgh Postnatal Depression Scale Screening Tool  I have been able to laugh and see the funny side of things. --      After visit meds:  Allergies as of 02/16/2023   No Known Allergies      Medication List     TAKE these medications    acetaminophen 325 MG tablet Commonly known as: Tylenol Take 2 tablets (650 mg total) by  mouth every 4 (four) hours as needed (for pain scale < 4).   benzocaine-Menthol 20-0.5 % Aero Commonly known as: DERMOPLAST Apply 1 Application topically as needed for irritation (perineal discomfort).   ibuprofen 600 MG tablet Commonly known as: ADVIL Take 1 tablet (600 mg total) by mouth every 6 (six) hours.         Discharge home in stable condition Infant Feeding: Breast Infant Disposition:home with mother Discharge instruction: per After Visit Summary and Postpartum booklet. Activity: Advance as tolerated. Pelvic rest for 6 weeks.  Diet: routine diet Anticipated Birth Control: Unsure Postpartum Appointment:6 weeks Additional Postpartum F/U:  for circ  Future Appointments:No future appointments. Follow up Visit:  Follow-up Information     [facility]. Schedule an  appointment as soon as possible for a visit in 6 week(s).   Specialty: Obstetrics and Gynecology Contact information: [address] [place] [address] [phone]                    02/16/2023 Michael Litter, MD

## 2023-02-17 MED ORDER — POLYSACCHARIDE IRON COMPLEX 150 MG PO CAPS: 150.0000 mg | ORAL_CAPSULE | ORAL | 1 refills | Status: AC

## 2023-02-17 MED ORDER — POLYSACCHARIDE IRON COMPLEX 150 MG PO CAPS: 150.0000 mg | ORAL_CAPSULE | ORAL | Status: DC

## 2023-02-17 NOTE — Lactation Note (Signed)
This note was copied from a baby's chart. Lactation Consultation Note  Patient Name: Judy Boyle [id number] Date: 02/17/2023 Age:34 hours Reason for consult: Follow-up assessment;Maternal discharge;Term  P2, 39 wks, @ 53 hrs of life. Mom anticipates discharge today. Encouraged mom baby is right where he should be, cluster feeding over night to bring in milk and longer durations of feeding. Baby will be circumcised today- discussed baby sleepy post procedure- hand express and feed off spoon if needed. Infant comes to breast- football on left breast. Demonstrated getting baby to open bigger- discussed bottle/ pacifier use and pumping. Encouraged mom to keep working on big mouth latch with baby and use EBM or coconut oil after each feed. Discussed cluster feeding overnight/ early morning brings in our milk supply, shared expectations of milk coming in. Highlighted risk of engorgement. Discussed hand pump/express to soften breasts, motrin as anti-inflammatory, and ice packs for 10-20 minutes post feed/pumping if still over-full is the best treatments for inflamed/engorged breasts.  Maternal Data Has patient been taught Hand Expression?: Yes Does the patient have breastfeeding experience prior to this delivery?: Yes  Feeding Mother's Current Feeding Choice: Breast Milk  LATCH Score Latch: Grasps breast easily, tongue down, lips flanged, rhythmical sucking.  Audible Swallowing: Spontaneous and intermittent  Type of Nipple: Everted at rest and after stimulation  Comfort (Breast/Nipple): Soft / non-tender  Hold (Positioning): Assistance needed to correctly position infant at breast and maintain latch.  LATCH Score: 9   Lactation Tools Discussed/Used    Interventions Interventions: Breast feeding basics reviewed;Assisted with latch;Hand express;Breast compression;Support pillows;Education  Discharge Discharge Education: Engorgement and breast care Pump:  DEBP;Personal;Manual  Consult Status Consult Status: Complete Date: 02/17/23    Idamae Lusher 02/17/2023, 10:33 AM

## 2023-02-17 NOTE — Discharge Instructions (Signed)
  Hedda, 1. Do not do any heavy lifting, i.e nothing heavier than 15 lbs for the next 6 weeks.  2.  Do not use tampons or douche or take baths, do not have any sexual intercourse or anything inside the vagina for the next 6 weeks.  3. Take your pain medication as needed for pain, let us know if the pain is not well controlled despite pain medication use.  4. Take your iron tablets daily for anemia.  You may also take a stool softener e.g colace if you are constipated.    5.  If you get a fever while at home, do check your temperature and if it is equal to or greater than 100.4 please call the office.   6. Some vaginal bleeding is expected and normal after your delivery. Please let us know if if it excessive where you saturate 1 pad in less than 2 hours or so.  7. Please let us know if with depression or anxiety symptoms, or symptoms of uncontrolled blood pressure such as headache, vision changes, nausea, vomiting, chest pain, shortness of breath.   [facility] [phone].

## 2023-02-24 ENCOUNTER — Telehealth (HOSPITAL_COMMUNITY): Payer: Self-pay

## 2023-02-24 NOTE — Telephone Encounter (Signed)
 02/24/2023 1147  Name: Judy Boyle MRN: [id number] DOB: 1991/01/05  Reason for Call:  Transition of Care Hospital Discharge Call  Contact Status: Patient Contact Status: Message  Language assistant needed:          Follow-Up Questions:    Van Postnatal Depression Scale:  In the Past 7 Days:    PHQ2-9 Depression Scale:     Discharge Follow-up:    Post-discharge interventions: NA  Signature  Rosaline Deretha PEAK

## 2023-02-27 ENCOUNTER — Telehealth: Payer: Self-pay | Admitting: Family Medicine

## 2023-02-27 NOTE — Telephone Encounter (Signed)
 error
